# Patient Record
Sex: Male | Born: 1965 | Race: White | Hispanic: No | Marital: Married | Smoking: Never smoker
Health system: Southern US, Community
[De-identification: ages and names within clinical notes are randomized; demographics above are authoritative.]

## PROBLEM LIST (undated history)

## (undated) DIAGNOSIS — I10 Essential (primary) hypertension: Secondary | ICD-10-CM

## (undated) DIAGNOSIS — Z87442 Personal history of urinary calculi: Secondary | ICD-10-CM

## (undated) HISTORY — PX: OTHER SURGICAL HISTORY: SHX169

## (undated) HISTORY — DX: Essential (primary) hypertension: I10

---
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
## 2005-12-10 ENCOUNTER — Ambulatory Visit: Payer: Self-pay | Admitting: Internal Medicine

## 2005-12-10 LAB — CONVERTED CEMR LAB
ALT: 24 units/L (ref 0–40)
AST: 21 units/L (ref 0–37)
Albumin: 4 g/dL (ref 3.5–5.2)
Alkaline Phosphatase: 57 units/L (ref 39–117)
BUN: 12 mg/dL (ref 6–23)
Basophils Absolute: 0.1 10*3/uL (ref 0.0–0.1)
Basophils Relative: 0.9 % (ref 0.0–1.0)
CO2: 30 meq/L (ref 19–32)
Calcium: 9.1 mg/dL (ref 8.4–10.5)
Chloride: 106 meq/L (ref 96–112)
Chol/HDL Ratio, serum: 3.4
Cholesterol: 140 mg/dL (ref 0–200)
Creatinine, Ser: 1 mg/dL (ref 0.4–1.5)
Eosinophil percent: 3.2 % (ref 0.0–5.0)
GFR calc non Af Amer: 88 mL/min
Glomerular Filtration Rate, Af Am: 106 mL/min/{1.73_m2}
Glucose, Bld: 85 mg/dL (ref 70–99)
HCT: 42 % (ref 39.0–52.0)
HDL: 41.2 mg/dL (ref >39.0)
Hemoglobin: 14.6 g/dL (ref 13.0–17.0)
LDL Cholesterol: 86 mg/dL (ref 0–99)
Lymphocytes Relative: 23.7 % (ref 12.0–46.0)
MCHC: 34.8 g/dL (ref 30.0–36.0)
MCV: 87.3 fL (ref 78.0–100.0)
Monocytes Absolute: 0.4 10*3/uL (ref 0.2–0.7)
Monocytes Relative: 7.2 % (ref 3.0–11.0)
Neutro Abs: 3.8 10*3/uL (ref 1.4–7.7)
Neutrophils Relative %: 65 % (ref 43.0–77.0)
Platelets: 244 10*3/uL (ref 150–400)
Potassium: 4 meq/L (ref 3.5–5.1)
RBC: 4.81 M/uL (ref 4.22–5.81)
RDW: 12.3 % (ref 11.5–14.6)
Sodium: 141 meq/L (ref 135–145)
TSH: 0.55 microintl units/mL (ref 0.35–5.50)
Total Bilirubin: 0.8 mg/dL (ref 0.3–1.2)
Total Protein: 6.5 g/dL (ref 6.0–8.3)
Triglyceride fasting, serum: 63 mg/dL (ref 0–149)
VLDL: 13 mg/dL (ref 0–40)
WBC: 5.9 10*3/uL (ref 4.5–10.5)

## 2005-12-15 ENCOUNTER — Ambulatory Visit: Payer: Self-pay | Admitting: Internal Medicine

## 2007-08-01 ENCOUNTER — Ambulatory Visit: Payer: Self-pay | Admitting: Internal Medicine

## 2007-08-08 ENCOUNTER — Telehealth (INDEPENDENT_AMBULATORY_CARE_PROVIDER_SITE_OTHER): Payer: Self-pay | Admitting: *Deleted

## 2007-08-10 ENCOUNTER — Ambulatory Visit: Payer: Self-pay | Admitting: Internal Medicine

## 2009-06-11 ENCOUNTER — Ambulatory Visit: Payer: Self-pay | Admitting: Internal Medicine

## 2009-06-11 DIAGNOSIS — R0989 Other specified symptoms and signs involving the circulatory and respiratory systems: Secondary | ICD-10-CM | POA: Insufficient documentation

## 2009-06-11 DIAGNOSIS — R0609 Other forms of dyspnea: Secondary | ICD-10-CM

## 2009-06-11 DIAGNOSIS — L259 Unspecified contact dermatitis, unspecified cause: Secondary | ICD-10-CM | POA: Insufficient documentation

## 2009-06-26 ENCOUNTER — Encounter: Payer: Self-pay | Admitting: Internal Medicine

## 2009-07-23 ENCOUNTER — Encounter: Payer: Self-pay | Admitting: Internal Medicine

## 2009-09-25 ENCOUNTER — Encounter: Payer: Self-pay | Admitting: Internal Medicine

## 2009-11-20 ENCOUNTER — Encounter: Payer: Self-pay | Admitting: Internal Medicine

## 2010-02-19 ENCOUNTER — Telehealth: Payer: Self-pay | Admitting: Internal Medicine

## 2010-02-20 ENCOUNTER — Ambulatory Visit: Admit: 2010-02-20 | Payer: Self-pay | Admitting: Internal Medicine

## 2010-03-03 NOTE — Letter (Signed)
Summary: [facility]  [facility]   Imported By: Maryln Gottron 06/24/2009 13:07:24  _____________________________________________________________________  External Attachment:    Type:   Image     Comment:   External Document

## 2010-03-03 NOTE — Letter (Signed)
Summary: [facility]  [facility]   Imported By: Maryln Gottron 12/01/2009 13:35:27  _____________________________________________________________________  External Attachment:    Type:   Image     Comment:   External Document

## 2010-03-03 NOTE — Consult Note (Signed)
Summary: [facility]  [facility]   Imported By: Maryln Gottron 07/16/2009 14:45:56  _____________________________________________________________________  External Attachment:    Type:   Image     Comment:   External Document

## 2010-03-03 NOTE — Assessment & Plan Note (Signed)
Summary: eval for sleep apnea?/dm   Vital Signs:  Patient profile:   45 year old male Weight:      177 pounds Pulse rate:   72 / minute Pulse rhythm:   regular Resp:     12 per minute BP sitting:   114 / 78  (left arm) Cuff size:   regular  Vitals Entered By: Gladis Riffle, RN (Jun 11, 2009 12:40 PM) CC: wife c/o snoring--eval for sleep apnea--also right lower leg rash Is Patient Diabetic? No Comments taking pain med prn forachilles tendon   CC:  wife c/o snoring--eval for sleep apnea--also right lower leg rash.  History of Present Illness: wife concerned with snoring  in additionhas a rash---same leg that has he has been wearing a boot  Preventive Screening-Counseling & Management  Alcohol-Tobacco     Smoking Status: never  Current Medications (verified): 1)  No Medications 2)  Percocet 5-325 Mg Tabs (Oxycodone-Acetaminophen) .... As Needed Achilles Pain  Allergies (verified): No Known Drug Allergies  Social History: Smoking Status:  never  Physical Exam  General:  Well-developed,well-nourished,in no acute distress; alert,appropriate and cooperative throughout examination Head:  normocephalic and atraumatic.   Mouth:  good dentition, no dental plaque, and pharynx pink and moist.   Neck:  No deformities, masses, or tenderness noted. Skin:  papular rash right leg below knee   Impression & Recommendations:  Problem # 1:  CONTACT DERMATITIS (ICD-692.9) likely related to boot (achilles rupture) His updated medication list for this problem includes:    Triamcinolone Acetonide 0.5 % Crea (Triamcinolone acetonide) .Marland Kitchen... Apply bid to affected area  Problem # 2:  SNORING (ICD-786.09)  doubt sleep apnea  Orders: ENT Referral (ENT)  Complete Medication List: 1)  No Medications  2)  Percocet 5-325 Mg Tabs (Oxycodone-acetaminophen) .... As needed achilles pain 3)  Triamcinolone Acetonide 0.5 % Crea (Triamcinolone acetonide) .... Apply bid to affected  area Prescriptions: TRIAMCINOLONE ACETONIDE 0.5 % CREA (TRIAMCINOLONE ACETONIDE) apply bid to affected area  #30 grams x 1   Entered and Authorized by:   Birdie Sons MD   Signed by:   Birdie Sons MD on 06/11/2009   Method used:   Electronically to        [facility] [phone]* (retail)       [address]       [place], [place]  [address]       Ph: [phone] or [phone]       Fax: [phone]   RxID:   [id number]

## 2010-03-03 NOTE — Letter (Signed)
Summary: [facility]  [facility]   Imported By: Sherian Rein 10/10/2009 13:49:26  _____________________________________________________________________  External Attachment:    Type:   Image     Comment:   External Document

## 2010-03-03 NOTE — Letter (Signed)
Summary: [facility]  [facility]   Imported By: Maryln Gottron 07/28/2009 14:16:03  _____________________________________________________________________  External Attachment:    Type:   Image     Comment:   External Document

## 2010-03-05 NOTE — Progress Notes (Signed)
Summary: Pt having problems with jaw. Req eval or referral to spec  Phone Note Call from Patient Call back at Work Phone [phone]   Caller: Patient Summary of Call: Pt called and said that he is having problems with his jaw. Painful on one side. Click in and out. Pt has difficulty opening his mouth at times. Pt wants know if he needs to come in to be eval or can he get referral to spec? Pls advise.  Initial call taken by: Lucy Antigua,  February 19, 2010 2:19 PM  Follow-up for Phone Call        he may want to see his dentist first Follow-up by: Birdie Sons MD,  February 20, 2010 6:55 AM  Additional Follow-up for Phone Call Additional follow up Details #1::        Lft pt vm with info noted above.  Additional Follow-up by: Lucy Antigua,  February 20, 2010 8:06 AM

## 2010-04-03 ENCOUNTER — Encounter: Payer: Self-pay | Admitting: Family Medicine

## 2010-04-03 ENCOUNTER — Ambulatory Visit (INDEPENDENT_AMBULATORY_CARE_PROVIDER_SITE_OTHER): Payer: 59 | Admitting: Family Medicine

## 2010-04-03 VITALS — BP 124/84 | Temp 98.1°F | Ht 68.25 in | Wt 167.0 lb

## 2010-04-03 DIAGNOSIS — H9209 Otalgia, unspecified ear: Secondary | ICD-10-CM

## 2010-04-03 DIAGNOSIS — H6690 Otitis media, unspecified, unspecified ear: Secondary | ICD-10-CM

## 2010-04-03 MED ORDER — AMOXICILLIN 875 MG PO TABS: 875.0000 mg | ORAL_TABLET | Freq: Two times a day (BID) | ORAL | Status: AC

## 2010-04-03 NOTE — Patient Instructions (Signed)
Otitis Media (Middle Ear Infection)  You or your child has otitis media. This is an infection of the middle chamber of the ear. This condition is common in young children and often follows upper respiratory infections. Symptoms of otitis media may include earache or ear fullness, hearing loss, or fever. If the ear drum ruptures, a middle ear infection may also cause bloody or pus-like discharge from the ear. Fussiness, irritability, and persistent crying may be the only signs of otitis media in small children.  Otitis media can be caused by a germ (bacteria) or a virus. Medications to kill bacteria (antibiotics) may be used to treat bacterial otitis media. But antibiotics are not effective against viral infections. Not every case of bacterial otitis media requires antibiotics and depending on age, severity of infection, and other risk factors, observation may be all that is required. Ear drops or oral medicines may be prescribed to reduce pain, fever or congestion. Babies with ear infections should not be fed while lying on their backs. This increases the pressure and pain in the ear. Do not put cotton in the ear canal or clean it with cotton swabs. Swimming should be avoided if the eardrum has ruptured or if there is drainage from the ear canal. If your child experiences recurrent infections, your child may need to be referred to an Ear, Nose, and Throat specialist.  HOME CARE INSTRUCTIONS   Take any antibiotic as directed by your caregiver. You or your child may feel better in a few days, but take all medicine or the infection may not respond and may become more difficult to treat.    Only take over-the-counter or prescription medicines for pain, discomfort, or fever as directed by your caregiver. Do not give aspirin to children.   Otitis media can lead to complications including rupture of the ear drum, long term hearing loss, and more severe infections. Call your caregiver for follow-up care at the end of  treatment.  SEEK IMMEDIATE MEDICAL CARE IF:   Your or your child's problems do not improve within 2 to 3 days.    You or your child has an oral temperature above 100, not controlled by medicine.    Your baby is older than 3 months with a rectal temperature of 102 F (38.9 C) or higher.    Your baby is 3 months old or younger with a rectal temperature of 100.4 F (38 C) or higher.    Your child develops increased fussiness.    You or your child develops a stiff neck, severe headache, or confusion.    There is swelling around the ear.    There is dizziness, vomiting, unusual sleepiness, seizures, or twitching of facial muscles.    The pain or ear drainage persists beyond 2 days of antibiotic treatment.   Document Released: 02/26/2004 Document Re-Released: 07/08/2009  [employer] Patient Information 2011 [employer].

## 2010-04-03 NOTE — Progress Notes (Signed)
  Subjective:    Patient ID: John Carey, male    DOB: 1965/11/12, 45 y.o.   MRN: [id number]  HPI  Patient seen with upper respiratory infection which started last weekend. The symptoms gradually started to clear but now has right ear fullness with minimal pain. No vertigo. Fever earlier in the week but none now. No cough. Overall feels better. History of frequent otitis in childhood.   Review of Systems     Objective:   Physical Exam  the patient is alert and nontoxic in appearance. Afebrile. Oropharynx is moist and clear Right eardrum reveals distorted landmarks. Moderate erythema. Nonbulging. No visible perforation. Left ear drum normal Neck is supple no adenopathy Chest good auscultation Heart regular rhythm and rate       Assessment & Plan:   recent viral URI now presenting with probably early right suppurative otitis media. Start amoxicillin 875 mg twice a day for 10 days

## 2010-06-11 ENCOUNTER — Other Ambulatory Visit (INDEPENDENT_AMBULATORY_CARE_PROVIDER_SITE_OTHER): Payer: 59 | Admitting: Internal Medicine

## 2010-06-11 DIAGNOSIS — Z0000 Encounter for general adult medical examination without abnormal findings: Secondary | ICD-10-CM

## 2010-06-11 LAB — LIPID PANEL
Cholesterol: 189 mg/dL (ref 0–200)
LDL Cholesterol: 116 mg/dL — ABNORMAL HIGH (ref 0–99)
Total CHOL/HDL Ratio: 3

## 2010-06-11 LAB — POCT URINALYSIS DIPSTICK
Bilirubin, UA: NEGATIVE
Glucose, UA: NEGATIVE
Leukocytes, UA: NEGATIVE
Nitrite, UA: NEGATIVE
Urobilinogen, UA: 0.2

## 2010-06-11 LAB — HEPATIC FUNCTION PANEL
ALT: 33 U/L (ref 0–53)
AST: 27 U/L (ref 0–37)
Bilirubin, Direct: 0.1 mg/dL (ref 0.0–0.3)
Total Bilirubin: 0.4 mg/dL (ref 0.3–1.2)
Total Protein: 6.6 g/dL (ref 6.0–8.3)

## 2010-06-11 LAB — CBC WITH DIFFERENTIAL/PLATELET
Basophils Relative: 0.9 % (ref 0.0–3.0)
Eosinophils Relative: 1.9 % (ref 0.0–5.0)
HCT: 45.7 % (ref 39.0–52.0)
Lymphs Abs: 2.3 10*3/uL (ref 0.7–4.0)
MCV: 91.9 fl (ref 78.0–100.0)
Monocytes Absolute: 0.7 10*3/uL (ref 0.1–1.0)
Monocytes Relative: 8.4 % (ref 3.0–12.0)
Platelets: 257 10*3/uL (ref 150.0–400.0)
RBC: 4.97 Mil/uL (ref 4.22–5.81)
WBC: 8.9 10*3/uL (ref 4.5–10.5)

## 2010-06-11 LAB — BASIC METABOLIC PANEL
BUN: 18 mg/dL (ref 6–23)
Chloride: 102 mEq/L (ref 96–112)
GFR: 90.92 mL/min (ref >60.00)
Potassium: 4.9 mEq/L (ref 3.5–5.1)
Sodium: 140 mEq/L (ref 135–145)

## 2010-06-11 LAB — TSH: TSH: 0.64 u[IU]/mL (ref 0.35–5.50)

## 2010-06-23 ENCOUNTER — Encounter: Payer: Self-pay | Admitting: Internal Medicine

## 2010-06-23 ENCOUNTER — Ambulatory Visit (INDEPENDENT_AMBULATORY_CARE_PROVIDER_SITE_OTHER): Payer: 59 | Admitting: Internal Medicine

## 2010-06-23 VITALS — BP 102/78 | HR 64 | Temp 98.6°F | Ht 68.5 in | Wt 170.0 lb

## 2010-06-23 DIAGNOSIS — Z23 Encounter for immunization: Secondary | ICD-10-CM

## 2010-06-23 DIAGNOSIS — Z0000 Encounter for general adult medical examination without abnormal findings: Secondary | ICD-10-CM

## 2010-06-23 NOTE — Progress Notes (Signed)
  Subjective:    Patient ID: John Carey, male    DOB: November 27, 1965, 45 y.o.   MRN: [id number]  HPI  cpx Exercises regularly  Past Medical History  Diagnosis Date  . Migraine    Past Surgical History  Procedure Date  . Kidney stone surgery     right-open removal    reports that he has never smoked. He does not have any smokeless tobacco history on file. He reports that he drinks alcohol. He reports that he does not use illicit drugs. family history includes Hypertension in his father and Parkinsonism in his mother.  There is no history of Diabetes. No Known Allergies  Review of Systems  patient denies chest pain, shortness of breath, orthopnea. Denies lower extremity edema, abdominal pain, change in appetite, change in bowel movements. Patient denies rashes, musculoskeletal complaints. No other specific complaints in a complete review of systems.      Objective:   Physical Exam  well-developed well-nourished male in no acute distress. HEENT exam atraumatic, normocephalic, neck supple without jugular venous distention. Chest clear to auscultation cardiac exam S1-S2 are regular. Abdominal exam  with bowel sounds, soft and nontender. Suprapubic scar. Extremities no edema. Neurologic exam is alert with a normal gait. GU---no hernia, no testicular masses        Assessment & Plan:  Well vist tdap today

## 2011-07-26 ENCOUNTER — Telehealth: Payer: Self-pay | Admitting: Internal Medicine

## 2011-07-26 NOTE — Telephone Encounter (Signed)
Caller: Thomson/Patient; PCP: Birdie Sons; CB#: [phone]; ; ; Call regarding Other; Onset 06/25/11; fingers lost circulation and were numb; patient was able to get feeling back after taking a warm shower; he reports that his fingers were white, cold, and numb; Patient reports this also happened last fall; Patient reports he spoke with EMS members at the football game where this happened and they recommended going to ED, but patient did not go due to hands returning to normal after getting warmed up. Emergent s/s of "Periodic changes in skin color (white or blue, followed by redness) with throbbing or tingling following cold exposure or emotional stress" positive per Hand Non-Injury guideline. Appointment scheduled with Dr. Amador Cunas for 07/27/11 at 8:15am to be evaluated for these symptoms.

## 2011-07-27 ENCOUNTER — Encounter: Payer: Self-pay | Admitting: Internal Medicine

## 2011-07-27 ENCOUNTER — Ambulatory Visit (INDEPENDENT_AMBULATORY_CARE_PROVIDER_SITE_OTHER): Payer: 59 | Admitting: Internal Medicine

## 2011-07-27 VITALS — BP 110/72 | Temp 97.8°F | Wt 161.0 lb

## 2011-07-27 DIAGNOSIS — I7300 Raynaud's syndrome without gangrene: Secondary | ICD-10-CM

## 2011-07-27 NOTE — Progress Notes (Signed)
  Subjective:    Patient ID: John Carey, male    DOB: 1965-10-13, 46 y.o.   MRN: [id number]  HPI  46 year old patient whose past medical history is unremarkable except for history depression and migraine headaches.  He complains of numbness and blanching of several fingertips that has occurred on 2 occasions. One in November of last year and another early last month both on exposure to the cold environment.  He took a picture with  his cell phone which he presents today with reveals extreme pallor involving 3 distal digits of the left hand and 2 digits of the right hand. Symptoms resolved with warming on both occasions. He has had no episodes precipitated by use of Relpax.    Review of Systems  Constitutional: Negative for fever, chills, appetite change and fatigue.  HENT: Negative for hearing loss, ear pain, congestion, sore throat, trouble swallowing, neck stiffness, dental problem, voice change and tinnitus.   Eyes: Negative for pain, discharge and visual disturbance.  Respiratory: Negative for cough, chest tightness, wheezing and stridor.   Cardiovascular: Negative for chest pain, palpitations and leg swelling.  Gastrointestinal: Negative for nausea, vomiting, abdominal pain, diarrhea, constipation, blood in stool and abdominal distention.  Genitourinary: Negative for urgency, hematuria, flank pain, discharge, difficulty urinating and genital sores.  Musculoskeletal: Negative for myalgias, back pain, joint swelling, arthralgias and gait problem.  Skin: Negative for rash.  Neurological: Positive for numbness. Negative for dizziness, syncope, speech difficulty, weakness and headaches.  Hematological: Negative for adenopathy. Does not bruise/bleed easily.  Psychiatric/Behavioral: Negative for behavioral problems and dysphoric mood. The patient is not nervous/anxious.        Objective:   Physical Exam  Constitutional: He appears well-developed and well-nourished. No distress.   Blood pressure 120/80 in both arms  Cardiovascular: Intact distal pulses.           Assessment & Plan:   Raynaud's  Phenomenon.  Information was dispensed he will attempt to avoid cold exposure to his hands. He'll will report any clinical worsening

## 2011-07-27 NOTE — Patient Instructions (Addendum)
Protect  hands from cold exposure as discussed  Call or return to clinic prn if these symptoms worsen or fail to improve as anticipated. Raynaud's Syndrome Raynaud's Syndrome is a disorder of the blood vessels in your hands and feet. It occurs when small arteries of the arms/hands or legs/feet become sensitive to cold or emotional upset. This causes the arteries to constrict, or narrow, and reduces blood flow to the area. The color in the fingers or toes changes from white to bluish to red and this is not usually painful. There may be numbness and tingling. Sores on the skin (ulcers) can form. Symptoms are usually relieved by warming. HOME CARE INSTRUCTIONS    Avoid exposure to cold. Keep your whole body warm and dry. Dress in layers. Wear mittens or gloves when handling ice or frozen food and when outdoors. Use holders for glasses or cans containing cold drinks. If possible, stay indoors during cold weather.   Limit your use of caffeine. Switch to decaffeinated coffee, tea, and soda pop. Avoid chocolate.   Avoid smoking or being around cigarette smoke. Smoke will make symptoms worse.   Wear loose fitting socks and comfortable, roomy shoes.   Avoid vibrating tools and machinery.   If possible, avoid stressful and emotional situations. Exercise, meditation and yoga may help you cope with stress. Biofeedback may be useful.   Ask your caregiver about medicine (calcium channel blockers) that may control Raynaud's phenomena.  SEEK MEDICAL CARE IF:    Your discomfort becomes worse, despite conservative treatment.   You develop sores on your fingers and toes that do not heal.  Document Released: 01/16/2000 Document Revised: 01/07/2011 Document Reviewed: 01/23/2008 [facility] Patient Information 2012 [place], [place].

## 2013-03-26 ENCOUNTER — Encounter: Payer: Self-pay | Admitting: Family Medicine

## 2013-03-26 ENCOUNTER — Ambulatory Visit (INDEPENDENT_AMBULATORY_CARE_PROVIDER_SITE_OTHER): Payer: 59 | Admitting: Family Medicine

## 2013-03-26 VITALS — BP 140/90 | Temp 97.9°F | Wt 167.0 lb

## 2013-03-26 DIAGNOSIS — K5900 Constipation, unspecified: Secondary | ICD-10-CM

## 2013-03-26 NOTE — Patient Instructions (Signed)
-  daily fiber supplement   -mirilax when constipated - mix in liquid per instructions once daily for 1 week (if not successful in 3 days twice daily for 3 days)  -follow up fo physical next available with your doctor and as needed

## 2013-03-26 NOTE — Progress Notes (Signed)
Chief Complaint  Patient presents with  . Abdominal Pain    and back pain, tender to touch, bloating     HPI:  John Carey is a 48 yo patient of Dr. Leanne Chang with a PMH migraines and nephrolithiasis here for an Acute visit for:  Abd discomfort: -none currently -started:    2 days ago                                                                   -location and character:no pain currently, reports" not a bad pain', intermittent, more a cramp, had some muscle pain in R back 3 days ago,then had pain in R mid abd, was a little tender to touch 2 days ago, feels full    -other symptoms: had constipation last week and took laxative yesterday and had a BM and now pain is much better today -denies: fevers, vomiting, diarrhea, hematochezia, melena, severe pain, urinary symptoms, normal appetite, tol POs normally -has hx of intermittent issues with constipation  ROS: See pertinent positives and negatives per HPI.  Past Medical History  Diagnosis Date  . Migraine     Past Surgical History  Procedure Laterality Date  . Kidney stone surgery      right-open removal    Family History  Problem Relation Age of Onset  . Parkinsonism Mother   . Hypertension Father   . Diabetes Neg Hx     History   Social History  . Marital Status: Married    Spouse Name: N/A    Number of Children: N/A  . Years of Education: N/A   Social History Main Topics  . Smoking status: Never Smoker   . Smokeless tobacco: None  . Alcohol Use: Yes     Comment: social  . Drug Use: No  . Sexual Activity: None   Other Topics Concern  . None   Social History Narrative  . None    Current outpatient prescriptions:eletriptan (RELPAX) 40 MG tablet, One tablet by mouth as needed for migraine headache.  If the headache improves and then returns, dose may be repeated after 2 hours have elapsed since first dose (do not exceed 80 mg per day). may repeat in 2 hours if necessary, per Dr Domingo Cocking , Disp: , Rfl: ;   Milnacipran HCl (SAVELLA) 50 MG TABS, Take by mouth daily. Per Dr Domingo Cocking , Disp: , Rfl:   EXAM:  Filed Vitals:   03/26/13 1103  BP: 140/90  Temp: 97.9 F (36.6 C)    Body mass index is 25.02 kg/(m^2).  GENERAL: vitals reviewed and listed above, alert, oriented, appears well hydrated and in no acute distress  HEENT: atraumatic, conjunttiva clear, no obvious abnormalities on inspection of external nose and ears  NECK: no obvious masses on inspection  LUNGS: clear to auscultation bilaterally, no wheezes, rales or rhonchi, good air movement  CV: HRRR, no peripheral edema  ABD: BS +, soft, NTTP  MS: moves all extremities without noticeable abnormality  PSYCH: pleasant and cooperative, no obvious depression or anxiety  ASSESSMENT AND PLAN:  Discussed the following assessment and plan:  Unspecified constipation  -pain is now resolved after tx for constipation and benign exam -we discussed possible serious and likely etiologies, workup and treatment, treatment risks and  return precautions -after this discussion, John Carey opted for tx of constipation -follow up advised with PCP for physical an as needed -of course, we advised John Carey  to return or notify a doctor immediately if symptoms worsen or persist or new concerns arise.  -Patient advised to return or notify a doctor immediately if symptoms worsen or persist or new concerns arise.  Patient Instructions  -daily fiber supplement   -mirilax when constipated - mix in liquid per instructions once daily for 1 week (if not successful in 3 days twice daily for 3 days)  -follow up fo physical next available with your doctor and as needed     Colin Benton R.

## 2013-03-26 NOTE — Progress Notes (Signed)
Pre visit review using our clinic review tool, if applicable. No additional management support is needed unless otherwise documented below in the visit note. 

## 2013-05-28 ENCOUNTER — Encounter: Payer: Self-pay | Admitting: Family Medicine

## 2013-05-28 ENCOUNTER — Ambulatory Visit (INDEPENDENT_AMBULATORY_CARE_PROVIDER_SITE_OTHER): Payer: 59 | Admitting: Family Medicine

## 2013-05-28 VITALS — BP 138/88 | HR 68 | Temp 98.1°F | Ht 68.0 in | Wt 174.0 lb

## 2013-05-28 DIAGNOSIS — IMO0001 Reserved for inherently not codable concepts without codable children: Secondary | ICD-10-CM

## 2013-05-28 DIAGNOSIS — K5900 Constipation, unspecified: Secondary | ICD-10-CM

## 2013-05-28 DIAGNOSIS — J310 Chronic rhinitis: Secondary | ICD-10-CM

## 2013-05-28 DIAGNOSIS — R030 Elevated blood-pressure reading, without diagnosis of hypertension: Secondary | ICD-10-CM

## 2013-05-28 DIAGNOSIS — J329 Chronic sinusitis, unspecified: Secondary | ICD-10-CM

## 2013-05-28 NOTE — Progress Notes (Signed)
Pre visit review using our clinic review tool, if applicable. No additional management support is needed unless otherwise documented below in the visit note. 

## 2013-05-28 NOTE — Progress Notes (Signed)
No chief complaint on file.   HPI:  Acute visit for:  1) Ear Pain: -nasal congestion, R ear pressure and popping, PND for about 1 week -denies ear pain, fevers, body aches, SOB, sinus pain, ebola riks -thinks allergies, taking zyrtec  2) Elevated BP: -reports never elevated like this  3) Constipation: -resolved ROS: See pertinent positives and negatives per HPI.  Past Medical History  Diagnosis Date  . Migraine     Past Surgical History  Procedure Laterality Date  . Kidney stone surgery      right-open removal    Family History  Problem Relation Age of Onset  . Parkinsonism Mother   . Hypertension Father   . Diabetes Neg Hx     History   Social History  . Marital Status: Married    Spouse Name: N/A    Number of Children: N/A  . Years of Education: N/A   Social History Main Topics  . Smoking status: Never Smoker   . Smokeless tobacco: None  . Alcohol Use: Yes     Comment: social  . Drug Use: No  . Sexual Activity: None   Other Topics Concern  . None   Social History Narrative  . None    Current outpatient prescriptions:eletriptan (RELPAX) 40 MG tablet, One tablet by mouth as needed for migraine headache.  If the headache improves and then returns, dose may be repeated after 2 hours have elapsed since first dose (do not exceed 80 mg per day). may repeat in 2 hours if necessary, per Dr Domingo Cocking , Disp: , Rfl: ;  Milnacipran HCl (SAVELLA) 50 MG TABS, Take by mouth daily. Per Dr Domingo Cocking , Disp: , Rfl:   EXAM:  Filed Vitals:   05/28/13 1310  BP: 138/88  Pulse: 68  Temp: 98.1 F (36.7 C)    Body mass index is 26.46 kg/(m^2).  GENERAL: vitals reviewed and listed above, alert, oriented, appears well hydrated and in no acute distress  HEENT: atraumatic, conjunttiva clear, no obvious abnormalities on inspection of external nose and ears, normal appearance of ear canals and TMs, clear nasal congestion, mild post oropharyngeal erythema with PND, no  tonsillar edema or exudate, no sinus TTP  NECK: no obvious masses on inspection  LUNGS: clear to auscultation bilaterally, no wheezes, rales or rhonchi, good air movement  CV: HRRR, no peripheral edema  MS: moves all extremities without noticeable abnormality  PSYCH: pleasant and cooperative, no obvious depression or anxiety  ASSESSMENT AND PLAN:  Discussed the following assessment and plan:  Rhinosinusitis -likely viral or allergic -discussed options, tx per instructions, return precautions  Elevated BP -much better on recheck, monitor at physical - advised to schedule  physical in next 1-2 months  Unspecified constipation -reports completely resolved  -Patient advised to return or notify a doctor immediately if symptoms worsen or persist or new concerns arise.  Patient Instructions  INSTRUCTIONS FOR UPPER RESPIRATORY INFECTION:  -plenty of rest and fluids  -nasal steroid such as nasocort per instructions for 21 days - effect usually in 1-2 weeks  -can use afrin nasal spray for drainage and nasal congestion - but do NOT use longer then 3-4 days  -follow up if you have fevers, facial pain, tooth pain, difficulty breathing or are worsening or not getting better in 5-7 days      Lucretia Kern

## 2013-05-28 NOTE — Patient Instructions (Signed)
INSTRUCTIONS FOR UPPER RESPIRATORY INFECTION:  -plenty of rest and fluids  -nasal steroid such as nasocort per instructions for 21 days - effect usually in 1-2 weeks  -can use afrin nasal spray for drainage and nasal congestion - but do NOT use longer then 3-4 days  -follow up if you have fevers, facial pain, tooth pain, difficulty breathing or are worsening or not getting better in 5-7 days

## 2016-02-06 ENCOUNTER — Encounter: Payer: Self-pay | Admitting: Family Medicine

## 2016-02-06 ENCOUNTER — Ambulatory Visit (INDEPENDENT_AMBULATORY_CARE_PROVIDER_SITE_OTHER): Payer: 59 | Admitting: Family Medicine

## 2016-02-06 VITALS — BP 120/82 | HR 76 | Temp 98.1°F | Ht 68.0 in | Wt 167.5 lb

## 2016-02-06 DIAGNOSIS — H65191 Other acute nonsuppurative otitis media, right ear: Secondary | ICD-10-CM

## 2016-02-06 DIAGNOSIS — J069 Acute upper respiratory infection, unspecified: Secondary | ICD-10-CM | POA: Diagnosis not present

## 2016-02-06 DIAGNOSIS — H6981 Other specified disorders of Eustachian tube, right ear: Secondary | ICD-10-CM | POA: Diagnosis not present

## 2016-02-06 MED ORDER — AMOXICILLIN 875 MG PO TABS: 875.0000 mg | ORAL_TABLET | Freq: Two times a day (BID) | ORAL | 0 refills | Status: DC

## 2016-02-06 NOTE — Progress Notes (Signed)
HPI:  Acute visit for R Ear Pain: -started: 1 week ago -symptoms:nasal congestion, sore throat, cough - this got better but has had some R ear pain for 3 days  -denies:fever, SOB, NVD, tooth pain, drainage from ear -has tried: nothing -sick contacts/travel/risks: no reported flu, strep or tick exposure -Hx of: hx ear infections  ROS: See pertinent positives and negatives per HPI.  Past Medical History:  Diagnosis Date  . Migraine     Past Surgical History:  Procedure Laterality Date  . KIDNEY STONE SURGERY     right-open removal    Family History  Problem Relation Age of Onset  . Parkinsonism Mother   . Hypertension Father   . Diabetes Neg Hx     Social History   Social History  . Marital status: Married    Spouse name: N/A  . Number of children: N/A  . Years of education: N/A   Social History Main Topics  . Smoking status: Never Smoker  . Smokeless tobacco: None  . Alcohol use Yes     Comment: social  . Drug use: No  . Sexual activity: Not Asked   Other Topics Concern  . None   Social History Narrative  . None     Current Outpatient Prescriptions:  .  eletriptan (RELPAX) 40 MG tablet, One tablet by mouth as needed for migraine headache.  If the headache improves and then returns, dose may be repeated after 2 hours have elapsed since first dose (do not exceed 80 mg per day). may repeat in 2 hours if necessary, per Dr Domingo Cocking , Disp: , Rfl:  .  Milnacipran HCl (SAVELLA) 50 MG TABS, Take by mouth daily. Per Dr Domingo Cocking , Disp: , Rfl:  .  amoxicillin (AMOXIL) 875 MG tablet, Take 1 tablet (875 mg total) by mouth 2 (two) times daily., Disp: 20 tablet, Rfl: 0  EXAM:  Vitals:   02/06/16 1535  BP: 120/82  Pulse: 76  Temp: 98.1 F (36.7 C)    Body mass index is 25.47 kg/m.  GENERAL: vitals reviewed and listed above, alert, oriented, appears well hydrated and in no acute distress  HEENT: atraumatic, conjunttiva clear, no obvious abnormalities on  inspection of external nose and ears, normal appearance of ear canals and TMs except for midly discolored effusion R with mild dullness and redness R TM, clear nasal congestion, mild post oropharyngeal erythema with PND, no tonsillar edema or exudate, no sinus TTP  NECK: no obvious masses on inspection  LUNGS: clear to auscultation bilaterally, no wheezes, rales or rhonchi, good air movement  CV: HRRR, no peripheral edema  MS: moves all extremities without noticeable abnormality  PSYCH: pleasant and cooperative, no obvious depression or anxiety  ASSESSMENT AND PLAN:  Discussed the following assessment and plan:  Acute upper respiratory infection  Acute effusion of right ear  Dysfunction of right eustachian tube  -given HPI and exam findings today, a serious infection or illness is unlikely. We discussed potential etiologies, with VURI and ETD with developing R OM being most likely. We discussed treatment options, side effects, likely course, antibiotic misuse, transmission, and signs of developing a serious illness. We opted to treat the ETD with a ashort course of nasal decongestant, INS x 3 weeks and abx if symptoms worsening or not resolving over the next few days. -due for colon cancer screening - discussed options and advised assistant to order cologuard per his preferences -advised pt to schedule CPE -of course, we advised to return  or notify a doctor immediately if symptoms worsen or persist or new concerns arise.    Patient Instructions  BEFORE YOU LEAVE: -order cologuard -follow up: 1) Schedule CPE with Dr. Maudie Mercury in next 2 months - if convenient please come fasting  AFRIN nasal spray per instructions for 4 days then stop. Do not use longer then 4 days.  Flonase 2 sprays each nostril daily for 3 weeks.  Take the antibiotic if worsening symptoms or symptoms persist over the next 3-5 days despite treatments above.     Colin Benton R., DO

## 2016-02-06 NOTE — Progress Notes (Signed)
Pre visit review using our clinic review tool, if applicable. No additional management support is needed unless otherwise documented below in the visit note. 

## 2016-02-06 NOTE — Patient Instructions (Signed)
BEFORE YOU LEAVE: -order cologuard -follow up: 1) Schedule CPE with Dr. Maudie Mercury in next 2 months - if convenient please come fasting  AFRIN nasal spray per instructions for 4 days then stop. Do not use longer then 4 days.  Flonase 2 sprays each nostril daily for 3 weeks.  Take the antibiotic if worsening symptoms or symptoms persist over the next 3-5 days despite treatments above.

## 2016-02-19 ENCOUNTER — Ambulatory Visit (INDEPENDENT_AMBULATORY_CARE_PROVIDER_SITE_OTHER): Payer: Self-pay | Admitting: Orthopedic Surgery

## 2016-02-20 ENCOUNTER — Ambulatory Visit (INDEPENDENT_AMBULATORY_CARE_PROVIDER_SITE_OTHER): Payer: 59 | Admitting: Orthopedic Surgery

## 2016-02-20 ENCOUNTER — Encounter (INDEPENDENT_AMBULATORY_CARE_PROVIDER_SITE_OTHER): Payer: Self-pay | Admitting: Orthopedic Surgery

## 2016-02-20 ENCOUNTER — Ambulatory Visit (INDEPENDENT_AMBULATORY_CARE_PROVIDER_SITE_OTHER): Payer: 59

## 2016-02-20 DIAGNOSIS — M1611 Unilateral primary osteoarthritis, right hip: Secondary | ICD-10-CM

## 2016-02-20 NOTE — Progress Notes (Signed)
   Office Visit Note   Patient: John Carey           Date of Birth: 26-Nov-1965           MRN: [id number] Visit Date: 02/20/2016 Requested by: Lisabeth Pick, MD [place], [place] [address] PCP: Lucretia Kern., DO  Subjective: Chief Complaint  Patient presents with  . Left Hip - Pain    HPI John Carey is a 51 year old active patient with right hip pain.  He's had a few months of increasing pain which typically he can deal with.  Last several weeks that it's been pain on a daily basis.  He localizes the pain to the groin.  He has really decreased his treadmill and running and has instead focused on your the.  He takes ibuprofen occasionally.  Patient states at times he is limping.  Some days are better than others.  The pain will not wake him from sleep.  He works in Editor, commissioning at [employer] reviewed are negative as they relate to the chief complaint within the history of present illness.  Patient denies  fevers or chills.    Assessment & Plan: Visit Diagnoses:  1. Primary osteoarthritis of right hip     Plan: Impression is right hip arthritis.  Plan is for him to continue with activity as tolerated until he really has enough pain that warrants surgical intervention.  For some of this acute pain that he's having I do want to try a hip injection with Dr. Ernestina Patches.  I will see him back as needed.  Follow-Up Instructions: Return for Follow-up with Dr. Ernestina Patches for hip injection.   Orders:  Orders Placed This Encounter  Procedures  . XR HIP UNILAT W OR W/O PELVIS 1V RIGHT  . Ambulatory referral to Physical Medicine Rehab   No orders of the defined types were placed in this encounter.     Procedures: No procedures performed   Clinical Data: No additional findings.  Objective: Vital Signs: There were no vitals taken for this visit.  Physical Exam  Ortho Exam  Specialty Comments:  No specialty comments  available.  Imaging: Xr Hip Unilat W Or W/o Pelvis 1v Right  Result Date: 02/20/2016 AP lateral right hip reviewed.  End-stage arthritis is present.  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  Bone quality of the cortex of the femur is otherwise intact    PMFS History: Patient Active Problem List   Diagnosis Date Noted  . Raynauds phenomenon 07/27/2011   Past Medical History:  Diagnosis Date  . Migraine     Family History  Problem Relation Age of Onset  . Parkinsonism Mother   . Hypertension Father   . Diabetes Neg Hx     Past Surgical History:  Procedure Laterality Date  . KIDNEY STONE SURGERY     right-open removal   Social History   Occupational History  . Not on file.   Social History Main Topics  . Smoking status: Never Smoker  . Smokeless tobacco: Never Used  . Alcohol use Yes     Comment: social  . Drug use: No  . Sexual activity: Not on file

## 2016-02-24 ENCOUNTER — Ambulatory Visit (INDEPENDENT_AMBULATORY_CARE_PROVIDER_SITE_OTHER): Payer: 59 | Admitting: Physical Medicine and Rehabilitation

## 2016-02-24 ENCOUNTER — Encounter (INDEPENDENT_AMBULATORY_CARE_PROVIDER_SITE_OTHER): Payer: Self-pay | Admitting: Physical Medicine and Rehabilitation

## 2016-02-24 VITALS — BP 127/83

## 2016-02-24 DIAGNOSIS — M25551 Pain in right hip: Secondary | ICD-10-CM

## 2016-02-24 NOTE — Progress Notes (Signed)
   John Carey - 51 y.o. male MRN [id number]  Date of birth: Feb 20, 1965  Office Visit Note: Visit Date: 02/24/2016 PCP: Lucretia Kern., DO Referred by: Lucretia Kern, DO  Subjective: Chief Complaint  Patient presents with  . Right Hip - Pain   HPI: [name] is a 51 year old gentleman with chronic worsening severe right hip and groin pain for a couple of years and worse recently. Pain level varies. Worse with certain movements. Occasionally radiates down leg to calf.he has failed conservative care and Dr. Marlou Sa requests a diagnostic and hopefully therapeutic injection.    ROS Otherwise per HPI.  Assessment & Plan: Visit Diagnoses:  1. Pain in right hip     Plan: Findings:  Right hip anesthetic arthrogram diagnostically with therapeutic. Patient did get good relief in anesthetic portion of the injection.    Meds & Orders: No orders of the defined types were placed in this encounter.   Orders Placed This Encounter  Procedures  . Large Joint Injection/Arthrocentesis    Follow-up: Return if symptoms worsen or fail to improve, 2 weeks.   Procedures: Hip anesthetic arthrogram Date/Time: 02/24/2016 4:01 PM Performed by: Magnus Sinning Authorized by: Magnus Sinning   Consent Given by:  Patient Site marked: the procedure site was marked   Timeout: prior to procedure the correct patient, procedure, and site was verified   Indications:  Pain and diagnostic evaluation Location:  Hip Site:  R hip joint Prep: patient was prepped and draped in usual sterile fashion   Needle Size:  22 G Approach:  Anterior Ultrasound Guidance: No   Fluoroscopic Guidance: No   Arthrogram: Yes   Medications:  4 mL lidocaine 2 %; 80 mg triamcinolone acetonide 40 MG/ML Aspiration Attempted: Yes   Patient tolerance:  Patient tolerated the procedure well with no immediate complications  Arthrogram demonstrated excellent flow of contrast throughout the joint surface without extravasation or  obvious defect.  The patient had relief of symptoms during the anesthetic phase of the injection.      No notes on file   Clinical History: No specialty comments available.  He reports that he has never smoked. He has never used smokeless tobacco. No results for input(s): HGBA1C, LABURIC in the last 8760 hours.  Objective:  VS:  HT:    WT:   BMI:     BP:127/83  HR: bpm  TEMP: ( )  RESP:  Physical Exam  Musculoskeletal:  Good distal strength and ambulates without aid but he does have concordant pain with internal rotation of the right hip.    Ortho Exam Imaging: No results found.  Past Medical/Family/Surgical/Social History: Medications & Allergies reviewed per EMR Patient Active Problem List   Diagnosis Date Noted  . Raynauds phenomenon 07/27/2011   Past Medical History:  Diagnosis Date  . Migraine    Family History  Problem Relation Age of Onset  . Parkinsonism Mother   . Hypertension Father   . Diabetes Neg Hx    Past Surgical History:  Procedure Laterality Date  . KIDNEY STONE SURGERY     right-open removal   Social History   Occupational History  . Not on file.   Social History Main Topics  . Smoking status: Never Smoker  . Smokeless tobacco: Never Used  . Alcohol use Yes     Comment: social  . Drug use: No  . Sexual activity: Not on file

## 2016-02-24 NOTE — Patient Instructions (Signed)

## 2016-02-25 MED ORDER — LIDOCAINE HCL 2 % IJ SOLN
4.0000 mL | INTRAMUSCULAR | Status: AC | PRN
  Administered 2016-02-24: 4 mL

## 2016-02-25 MED ORDER — TRIAMCINOLONE ACETONIDE 40 MG/ML IJ SUSP
80.0000 mg | INTRAMUSCULAR | Status: AC | PRN
  Administered 2016-02-24: 80 mg via INTRA_ARTICULAR

## 2016-03-02 ENCOUNTER — Telehealth (INDEPENDENT_AMBULATORY_CARE_PROVIDER_SITE_OTHER): Payer: Self-pay | Admitting: Radiology

## 2016-03-02 NOTE — Telephone Encounter (Signed)
Rash at the injection site from last week.  He does itching. He said it appears almost to look like Poison ivy. He has been using hydrocortisone cream on but its not doing to much.  Asking what else he can do for it?

## 2016-03-02 NOTE — Telephone Encounter (Signed)
Take bendaryl oral 25mg  up to TID or find OTC benadryl lotion. Keep lotioned with regular lotion. If skin rash mainly should be self limited and may be due to chloroprep or bandaid most likely.  If worsens give [place] a call

## 2016-03-02 NOTE — Telephone Encounter (Signed)
Called patient and left message. Also advised him to call if the problem worsens or if he has any further questions.

## 2016-04-15 NOTE — Progress Notes (Signed)
HPI:  Here for CPE: Not seen for CPE in some time. Due for labs, colon cancer screening, prostate cancer screening discussion. -Concerns and/or follow up today: none  -Diet: variety of foods, balance and well rounded  -Exercise: regular exercise  -Diabetes and Dyslipidemia Screening:fasting  -Hx of HTN: no  -Vaccines: UTD  -sexual activity: yes, male partner, no new partners  -wants STI testing, Hep C screening (if born 08-1964): no  -FH colon or prstate ca: see FH Last colon cancer screening: not done, wants cologard Last prostate ca screening: discussed risks/benefits DRE and PSA and offered given Fx - he declined both today  -Alcohol, Tobacco, drug use: see social history  Review of Systems - no fevers, unintentional weight loss, vision loss, hearing loss, chest pain, sob, hemoptysis, melena, hematochezia, hematuria, genital discharge, changing or concerning skin lesions, bleeding, bruising, loc, thoughts of self harm or SI  Past Medical History:  Diagnosis Date  . Migraine     Past Surgical History:  Procedure Laterality Date  . KIDNEY STONE SURGERY     right-open removal    Family History  Problem Relation Age of Onset  . Parkinsonism Mother   . Hypertension Father   . Prostate cancer Father 50  . Diabetes Neg Hx     Social History   Social History  . Marital status: Married    Spouse name: N/A  . Number of children: N/A  . Years of education: N/A   Social History Main Topics  . Smoking status: Never Smoker  . Smokeless tobacco: Never Used  . Alcohol use Yes     Comment: social  . Drug use: No  . Sexual activity: Not Asked   Other Topics Concern  . None   Social History Narrative  . None     Current Outpatient Prescriptions:  .  eletriptan (RELPAX) 40 MG tablet, One tablet by mouth as needed for migraine headache.  If the headache improves and then returns, dose may be repeated after 2 hours have elapsed since first dose (do not  exceed 80 mg per day). may repeat in 2 hours if necessary, per Dr Domingo Cocking , Disp: , Rfl:  .  Milnacipran HCl (SAVELLA) 50 MG TABS, Take by mouth daily. Per Dr Domingo Cocking , Disp: , Rfl:   EXAM:  Vitals:   04/16/16 0818  BP: 118/70  Pulse: (!) 58  Temp: 98.2 F (36.8 C)  TempSrc: Oral  Weight: 162 lb 6.4 oz (73.7 kg)  Height: 5' 7.5" (1.715 m)    Estimated body mass index is 25.06 kg/m as calculated from the following:   Height as of this encounter: 5' 7.5" (1.715 m).   Weight as of this encounter: 162 lb 6.4 oz (73.7 kg).  GENERAL: vitals reviewed and listed below, alert, oriented, appears well hydrated and in no acute distress  HEENT: head atraumatic, PERRLA, normal appearance of eyes, ears, nose and mouth. moist mucus membranes.  NECK: supple, no masses or lymphadenopathy  LUNGS: clear to auscultation bilaterally, no rales, rhonchi or wheeze  CV: HRRR, no peripheral edema or cyanosis, normal pedal pulses  ABDOMEN: bowel sounds normal, soft, non tender to palpation, no masses, no rebound or guarding  GU: Declined  SKIN: no rash or abnormal lesions, multiple moles -all appear rather similar  MS: normal gait, moves all extremities normally  NEURO: normal gait, speech and thought processing grossly intact, muscle tone grossly intact throughout  PSYCH: normal affect, pleasant and cooperative  ASSESSMENT AND PLAN:  Discussed  the following assessment and plan:  Encounter for preventive measure - Plan: Lipid panel, Hemoglobin A1c  Other migraine without status migrainosus, not intractable - -sees Dr. Bevelyn Ngo and relpax  Numerous moles   -Discussed and advised all [place] preventive services health task force level A and B recommendations for age, sex and risks.  --Advised at least 150 minutes of exercise per week and a healthy diet with avoidance of (less then 1 serving per week) processed foods, white starches, red meat, fast foods and sweets and consisting of: *  5-9 servings of fresh fruits and vegetables (not corn or potatoes) *nuts and seeds, beans *olives and olive oil *lean meats such as fish and white chicken  *whole grains  -skin check with derm every 1-2 years given many moles  -cologard - assistant to order  -labs, studies and vaccines per orders this encounter   Patient advised to return to clinic immediately if symptoms worsen or persist or new concerns.  Patient Instructions  BEFORE YOU LEAVE: -2 question depression screen in epic -cologard -follow up: 1 year for CPE -labs  We ordered the Cologuard test for colon cancer screening. Please complete this test promptly once the kit arrives. Please contact us if you have not received your kit in the next few weeks.  Vit D3 [phone] IU daily - Source naturals vit D3 drops, cosco or sma's club vit d3  We have ordered labs or studies at this visit. It can take up to 1-2 weeks for results and processing. IF results require follow up or explanation, we will call you with instructions. Clinically stable results will be released to your [facility]. If you have not heard from [place] or cannot find your results in [facility] in 2 weeks please contact our office at [phone].  If you are not yet signed up for [facility], please consider signing up.   We recommend the following healthy lifestyle for LIFE: 1) Small portions.   Tip: eat off of a salad plate instead of a dinner plate.  Tip: if you need more or a snack choose fruits, veggies and/or a handful of nuts or seeds.  2) Eat a healthy clean diet.  * Tip: Avoid (less then 1 serving per week): processed foods, sweets, sweetened drinks, white starches (rice, flour, bread, potatoes, pasta, etc), red meat, fast foods, butter  *Tip: CHOOSE instead   * 5-9 servings per day of fresh or frozen fruits and vegetables (but not corn, potatoes, bananas, canned or dried fruit)   *nuts and seeds, beans   *olives and olive oil   *small portions of lean meats  such as fish and white chicken    *small portions of whole grains  3)Get at least 150 minutes of sweaty aerobic exercise per week.  4)Reduce stress - consider counseling, meditation and relaxation to balance other aspects of your life.    WE NOW OFFER    [facility] FAST TRACK!!!  SAME DAY Appointments for ACUTE CARE  Such as: Sprains, Injuries, cuts, abrasions, rashes, muscle pain, joint pain, back pain Colds, flu, sore throats, headache, allergies, cough, fever  Ear pain, sinus and eye infections Abdominal pain, nausea, vomiting, diarrhea, upset stomach Animal/insect bites  3 Easy Ways to Schedule: Walk-In Scheduling Call in scheduling Mychart Sign-up: [email or web address]                     No Follow-up on file.   Colin Benton R., DO

## 2016-04-16 ENCOUNTER — Ambulatory Visit (INDEPENDENT_AMBULATORY_CARE_PROVIDER_SITE_OTHER): Payer: 59 | Admitting: Family Medicine

## 2016-04-16 ENCOUNTER — Encounter: Payer: Self-pay | Admitting: Family Medicine

## 2016-04-16 VITALS — BP 118/70 | HR 58 | Temp 98.2°F | Ht 67.5 in | Wt 162.4 lb

## 2016-04-16 DIAGNOSIS — G43809 Other migraine, not intractable, without status migrainosus: Secondary | ICD-10-CM | POA: Diagnosis not present

## 2016-04-16 DIAGNOSIS — D229 Melanocytic nevi, unspecified: Secondary | ICD-10-CM | POA: Diagnosis not present

## 2016-04-16 DIAGNOSIS — Z0000 Encounter for general adult medical examination without abnormal findings: Secondary | ICD-10-CM | POA: Diagnosis not present

## 2016-04-16 DIAGNOSIS — Z299 Encounter for prophylactic measures, unspecified: Secondary | ICD-10-CM

## 2016-04-16 DIAGNOSIS — G43909 Migraine, unspecified, not intractable, without status migrainosus: Secondary | ICD-10-CM | POA: Insufficient documentation

## 2016-04-16 LAB — LIPID PANEL
CHOL/HDL RATIO: 3
Cholesterol: 158 mg/dL (ref 0–200)
HDL: 50.8 mg/dL (ref >39.00)
LDL Cholesterol: 93 mg/dL (ref 0–99)
NonHDL: 107.24
Triglycerides: 73 mg/dL (ref 0.0–149.0)
VLDL: 14.6 mg/dL (ref 0.0–40.0)

## 2016-04-16 LAB — HEMOGLOBIN A1C: Hgb A1c MFr Bld: 5.5 % (ref 4.6–6.5)

## 2016-04-16 NOTE — Progress Notes (Signed)
Pre visit review using our clinic review tool, if applicable. No additional management support is needed unless otherwise documented below in the visit note. 

## 2016-04-16 NOTE — Patient Instructions (Signed)
BEFORE YOU LEAVE: -2 question depression screen in epic -cologard -follow up: 1 year for CPE -labs  We ordered the Cologuard test for colon cancer screening. Please complete this test promptly once the kit arrives. Please contact us if you have not received your kit in the next few weeks.  Vit D3 [phone] IU daily - Source naturals vit D3 drops, cosco or [employer]ma[employer] club vit d3  We have ordered labs or studies at this visit. It can take up to 1-2 weeks for results and processing. IF results require follow up or explanation, we will call you with instructions. Clinically stable results will be released to your [facility]. If you have not heard from [place] or cannot find your results in [facility] in 2 weeks please contact our office at [phone].  If you are not yet signed up for [facility], please consider signing up.   We recommend the following healthy lifestyle for LIFE: 1) Small portions.   Tip: eat off of a salad plate instead of a dinner plate.  Tip: if you need more or a snack choose fruits, veggies and/or a handful of nuts or seeds.  2) Eat a healthy clean diet.  * Tip: Avoid (less then 1 serving per week): processed foods, sweets, sweetened drinks, white starches (rice, flour, bread, potatoes, pasta, etc), red meat, fast foods, butter  *Tip: CHOOSE instead   * 5-9 servings per day of fresh or frozen fruits and vegetables (but not corn, potatoes, bananas, canned or dried fruit)   *nuts and seeds, beans   *olives and olive oil   *small portions of lean meats such as fish and white chicken    *small portions of whole grains  3)Get at least 150 minutes of sweaty aerobic exercise per week.  4)Reduce stress - consider counseling, meditation and relaxation to balance other aspects of your life.    WE NOW OFFER   [facility] FAST TRACK!!!  SAME DAY Appointments for ACUTE CARE  Such as: Sprains, Injuries, cuts, abrasions, rashes, muscle pain, joint pain, back pain Colds, flu, sore  throats, headache, allergies, cough, fever  Ear pain, sinus and eye infections Abdominal pain, nausea, vomiting, diarrhea, upset stomach Animal/insect bites  3 Easy Ways to Schedule: Walk-In Scheduling Call in scheduling Mychart Sign-up: [email or web address]

## 2016-04-22 ENCOUNTER — Telehealth (INDEPENDENT_AMBULATORY_CARE_PROVIDER_SITE_OTHER): Payer: Self-pay | Admitting: Radiology

## 2016-04-22 NOTE — Telephone Encounter (Signed)
John Carey left a voicemail requesting imaging of his right hip. He is aware CD has been made and is ready for pick-up.

## 2016-04-23 DIAGNOSIS — M1611 Unilateral primary osteoarthritis, right hip: Secondary | ICD-10-CM | POA: Diagnosis not present

## 2016-05-20 DIAGNOSIS — L812 Freckles: Secondary | ICD-10-CM | POA: Diagnosis not present

## 2016-05-20 DIAGNOSIS — L821 Other seborrheic keratosis: Secondary | ICD-10-CM | POA: Diagnosis not present

## 2016-05-20 DIAGNOSIS — C44612 Basal cell carcinoma of skin of right upper limb, including shoulder: Secondary | ICD-10-CM | POA: Diagnosis not present

## 2016-05-20 DIAGNOSIS — L570 Actinic keratosis: Secondary | ICD-10-CM | POA: Diagnosis not present

## 2016-05-20 DIAGNOSIS — Z85828 Personal history of other malignant neoplasm of skin: Secondary | ICD-10-CM | POA: Diagnosis not present

## 2016-05-20 DIAGNOSIS — L820 Inflamed seborrheic keratosis: Secondary | ICD-10-CM | POA: Diagnosis not present

## 2016-06-01 DIAGNOSIS — H524 Presbyopia: Secondary | ICD-10-CM | POA: Diagnosis not present

## 2016-06-07 DIAGNOSIS — G43019 Migraine without aura, intractable, without status migrainosus: Secondary | ICD-10-CM | POA: Diagnosis not present

## 2016-06-07 DIAGNOSIS — G43719 Chronic migraine without aura, intractable, without status migrainosus: Secondary | ICD-10-CM | POA: Diagnosis not present

## 2016-06-07 DIAGNOSIS — R51 Headache: Secondary | ICD-10-CM | POA: Diagnosis not present

## 2016-07-08 DIAGNOSIS — M1611 Unilateral primary osteoarthritis, right hip: Secondary | ICD-10-CM | POA: Diagnosis not present

## 2016-07-22 ENCOUNTER — Telehealth: Payer: Self-pay | Admitting: Family Medicine

## 2016-07-22 DIAGNOSIS — M1611 Unilateral primary osteoarthritis, right hip: Secondary | ICD-10-CM | POA: Diagnosis not present

## 2016-07-22 NOTE — Telephone Encounter (Signed)
Patient dropped off a [facility] Clearance form  Fax to: [phone] ATTN: Santiago Bur Disposition: Dr's Folder

## 2016-07-26 NOTE — Telephone Encounter (Signed)
Pt will need an appointment for medical clearance.  Please help schedule.  Thanks!!

## 2016-07-27 NOTE — Telephone Encounter (Signed)
° ° ° °  lmovm for pt to call back and schedule appt for medical clearance

## 2016-08-03 ENCOUNTER — Encounter: Payer: Self-pay | Admitting: Family Medicine

## 2016-08-03 ENCOUNTER — Ambulatory Visit (INDEPENDENT_AMBULATORY_CARE_PROVIDER_SITE_OTHER): Payer: 59 | Admitting: Family Medicine

## 2016-08-03 VITALS — BP 100/80 | HR 60 | Temp 97.6°F | Ht 67.5 in | Wt 167.5 lb

## 2016-08-03 DIAGNOSIS — R001 Bradycardia, unspecified: Secondary | ICD-10-CM

## 2016-08-03 DIAGNOSIS — R9431 Abnormal electrocardiogram [ECG] [EKG]: Secondary | ICD-10-CM | POA: Diagnosis not present

## 2016-08-03 DIAGNOSIS — Z01818 Encounter for other preprocedural examination: Secondary | ICD-10-CM

## 2016-08-03 DIAGNOSIS — M25551 Pain in right hip: Secondary | ICD-10-CM

## 2016-08-03 NOTE — Progress Notes (Signed)
No chief complaint on file.   HPI:  Patient is seen for optimization of general medical care prior to surgery. Surgery type: R total hip - interfering with activities, pain even with walking Date of surgery: end of august possibly, TBD - seeing Dr. Swinteck  Kidney disease? None except remote kidney stone Prior surgeries/Issues following anesthesia? Repair of achilles, no complications, did well Hx MI, heart arrythmia, CHF, angina or stroke? none Epilepsy or Seizures? none Arthritis or problems with neck or jaw? none Thyroid disease? none Liver disease? none Asthma, COPD or chronic lung disease? none Diabetes? none (Needs to be evaluated by anesthesia if yes to these questions.)  Other: Poor nutrition, Frail or other: no  METS:  ?Can take care of self, such as eat, dress, or use the toilet (1 MET). yes ?Can walk up a flight of steps or a hill (4 METs).yes ?Can do heavy work around the house such as scrubbing floors or lifting or moving heavy furniture (between 4 and 10 METs). yes ?Can participate in strenuous sports such as swimming, singles tennis, football, basketball, and skiing (>10 METs) yes . AHA Risks: Major predictors that require intensive management and may lead to delay in or cancellation of the operative procedure unless emergent: NONE  . Unstable coronary syndromes including unstable or severe angina or recent MI  . Decompensated heart failure including NYHA functional class IV or worsening or new-onset HF  . Significant arrhythmias including high grade AV block, symptomatic ventricular arrhythmias, supraventricular arrhythmias with ventricular rate >100 bpm at rest, symptomatic bradycardia, and newly recognized ventricular tachycardia  . Severe heart valve disease including severe aortic stenosis or symptomatic mitral stenosis   . History of ischemic heart disease . History of cerebrovascular disease  Other clinical predictors that warrant careful assessment of  current status: NONE  . History of compensated heart failure or prior heart failure  . Diabetes mellitus  . Renal insufficiency  Type of surgery and Risk: 1) High risk (reported risk of cardiac death or nonfatal myocardial infarction [MI] often greater than 5 percent):  . Aortic and other major vascular surgery  . Peripheral artery surgery   2)Intermediate risk (reported risk of cardiac death or nonfatal MI generally 1 to 5 percent):  . Carotid endarterectomy  . Head and neck surgery  . Intraperitoneal and intrathoracic surgery  . Orthopedic surgery  . Prostate surgery   3)Low risk (reported risk of cardiac death or nonfatal MI generally less than 1 percent):  . Ambulatory surgery  . Endoscopic procedures  . Superficial procedure  . Cataract surgery  . Breast surgery  Medications that need to be addressed prior to surgery: None Discontinue acei/arbs/non-statin lipid lowering drugs day of surgery ASA stop 7 days before or discuss with cardiology if CV risks, other anticoagulants discuss with cardiology.  ROS: See pertinent positives and negatives per HPI. 11 point ROS negative except where noted.  Past Medical History:  Diagnosis Date  . Migraine     Past Surgical History:  Procedure Laterality Date  . KIDNEY STONE SURGERY     right-open removal    Family History  Problem Relation Age of Onset  . Parkinsonism Mother   . Hypertension Father   . Prostate cancer Father 75  . Diabetes Neg Hx     Social History   Social History  . Marital status: Married    Spouse name: N/A  . Number of children: N/A  . Years of education: N/A   Social History Main Topics  .   Smoking status: Never Smoker  . Smokeless tobacco: Never Used  . Alcohol use Yes     Comment: social  . Drug use: No  . Sexual activity: Not Asked   Other Topics Concern  . None   Social History Narrative  . None     Current Outpatient Prescriptions:  .  eletriptan (RELPAX) 40 MG tablet, One  tablet by mouth as needed for migraine headache.  If the headache improves and then returns, dose may be repeated after 2 hours have elapsed since first dose (do not exceed 80 mg per day). may repeat in 2 hours if necessary, per Dr Freeman , Disp: , Rfl:   EXAM:  Vitals:   08/03/16 1316 08/03/16 1407  BP: 100/80   Pulse: (!) 49 60  Temp: 97.6 F (36.4 C)     Body mass index is 25.85 kg/m.  GENERAL: vitals reviewed and listed above, alert, oriented, appears well hydrated and in no acute distress  HEENT: atraumatic, conjunttiva clear, no obvious abnormalities on inspection of external nose and ears  NECK: no obvious masses on inspection, no carotid bruits  LUNGS: clear to auscultation bilaterally, no wheezes, rales or rhonchi, good air movement  CV: brady, no peripheral edema, no JVD, BP normal range, normal radial pulses  MS: moves all extremities without noticeable abnormality  PSYCH: pleasant and cooperative, no obvious depression or anxiety  ASSESSMENT AND PLAN:  Discussed the following assessment and plan:  Bradycardia - Plan: EKG 12-Lead, EKG 12-Lead, Ambulatory referral to Cardiology  Pre-op evaluation - Plan: Ambulatory referral to Cardiology  Abnormal EKG  Pain of right hip joint  Assessment: -HR low on intake, low end of normal on my exam. EKG shows brady,  does cross-fit several days per week and this may be fitness related. However, HR lower then in the past - around 49 today.Asymptomatic. However, would advise cardiology eval prior to surgery and referral placed.  -Risk factors: none -Surgery Risks:intermediate -age, nutritional status, fraility: good nutritional status, age <65, no fraility -functional capacity: > 8 METs wethout symptoms -comorbidities: none, no medications Patient Specific Risks: patient is low risk for intermediate risks surgery   Recommendations for optimizing general medical care prior to surgery: -cardiology eval prior to surgery  for cardiac clearance - will send referral and note to surgergy office -advised patient to discuss specific risks morbidity and mortality of surgery with surgeon, CV risks discussed with patient -advised patient will defer to surgeon for post-op DVT prophylaxis and post op care -form for pre-op optimization of general medical care prior to surgery faxed to surgeon office  -Patient advised to return or notify a doctor immediately if symptoms worsen or persist or new concerns arise.  Patient Instructions  -We placed a referral for you as discussed for cardiac clearance prior to surgery . It usually takes about 1-2 weeks to process and schedule this referral. If you have not heard from us regarding this appointment in 2 weeks please contact our office.     KIM, HANNAH R.   

## 2016-08-03 NOTE — Patient Instructions (Signed)
-  We placed a referral for you as discussed for cardiac clearance prior to surgery . It usually takes about 1-2 weeks to process and schedule this referral. If you have not heard from [place] regarding this appointment in 2 weeks please contact our office.

## 2016-08-29 NOTE — Progress Notes (Signed)
Cardiology Office Note    Date:  08/31/2016   ID:  Edgerrin, Correia 1965/03/02, MRN [id number]  PCP:  Lucretia Kern, DO  Cardiologist: New to [facility] - Dr. Claiborne Billings  Chief Complaint  Patient presents with  . New Patient (Initial Visit)    Referral for Bradycardia and Cardiac Clearance    History of Present Illness:    John Carey is a 51 y.o. male with past medical history of nephrolithiasis and no prior cardiac history who presents to the office today for evaluation of bradycardia at the request of Dr. Colin Benton.   He was evaluated by his PCP on 08/03/2016 for cardiac clearance in regards to an upcoming right hip replacement. An EKG was obtained at that time and showed sinus bradycardia, HR 49, with left atrial enlargement, therefore Cardiology evaluation was recommended prior to his surgery.   In talking with the patient today, he reports being very active at baseline and participates in "Cross-Fit type" classes multiple times per week. He is able to perform household chores and yard work without any anginal symptoms as well. He denies any history of chest discomfort or dyspnea on exertion. No recent orthopnea, PND, lower extremity edema, palpitations, lightheadedness, dizziness, or presyncope.  Reports his heart rate is usually in the 50's to 60's but that he started exercising more aggressively in 02/2016 and his heart rate has been slightly lower since then. He denies any symptoms at the time his heart rate was in the high-40's at his PCPs visit.  He denies any history of CAD, HTN, HLD, or Type 2 DM. Was told as a teenager he had MVP but in his 20's he was told this had resolved and he did not need further cardiac evaluation. Does have a family history of CAD with his mother having an MI in her 82's and paternal grandfather having an MI at age 62 (heavy alcohol and tobacco use). He denies any personal history of tobacco use or recreational drug use. Does consume alcohol socially.     Past Medical History:  Diagnosis Date  . Migraine     Past Surgical History:  Procedure Laterality Date  . [place]   right-open removal    Current Medications: Outpatient Medications Prior to Visit  Medication Sig Dispense Refill  . eletriptan (RELPAX) 40 MG tablet One tablet by mouth as needed for migraine headache.  If the headache improves and then returns, dose may be repeated after 2 hours have elapsed since first dose (do not exceed 80 mg per day). may repeat in 2 hours if necessary, per Dr Domingo Cocking      No facility-administered medications prior to visit.      Allergies:   Patient has no known allergies.   Social History   Social History  . Marital status: Married    Spouse name: N/A  . Number of children: N/A  . Years of education: N/A   Social History Main Topics  . Smoking status: Never Smoker  . Smokeless tobacco: Never Used  . Alcohol use Yes     Comment: social  . Drug use: No  . Sexual activity: Not Asked   Other Topics Concern  . None   Social History Narrative  . None     Family History:  The patient's family history includes CAD in his mother and paternal grandfather; Hypertension in his father; Parkinsonism in his mother; Prostate cancer (age of onset: 53) in his father.  Review of Systems:   Please see the history of present illness.     General:  No chills, fever, night sweats or weight changes.  Cardiovascular:  No chest pain, dyspnea on exertion, edema, orthopnea, palpitations, paroxysmal nocturnal dyspnea. Dermatological: No rash, lesions/masses Respiratory: No cough, dyspnea Urologic: No hematuria, dysuria MSK: Positive for right hip pain.  Abdominal:   No nausea, vomiting, diarrhea, bright red blood per rectum, melena, or hematemesis Neurologic:  No visual changes, wkns, changes in mental status.  All other systems reviewed and are otherwise negative except as noted above.   Physical Exam:    VS:  BP  122/86   Pulse 67   Ht 5\' 8"  (1.727 m)   Wt 163 lb 9.6 oz (74.2 kg)   SpO2 96%   BMI 24.88 kg/m    General: Well developed, well nourished Caucasian male appearing in no acute distress. Head: Normocephalic, atraumatic, sclera non-icteric, no xanthomas, nares are without discharge.  Neck: No carotid bruits. JVD not elevated.  Lungs: Respirations regular and unlabored, without wheezes or rales.  Heart: Regular rate and rhythm. No S3 or S4.  No murmur, no rubs, or gallops appreciated. Abdomen: Soft, non-tender, non-distended with normoactive bowel sounds. No hepatomegaly. No rebound/guarding. No obvious abdominal masses. Msk:  Strength and tone appear normal for age. No joint deformities or effusions. Extremities: No clubbing or cyanosis. No lower extremity edema.  Distal pedal pulses are 2+ bilaterally. Neuro: Alert and oriented X 3. Moves all extremities spontaneously. No focal deficits noted. Psych:  Responds to questions appropriately with a normal affect. Skin: No rashes or lesions noted  Wt Readings from Last 3 Encounters:  08/31/16 163 lb 9.6 oz (74.2 kg)  08/03/16 167 lb 8 oz (76 kg)  04/16/16 162 lb 6.4 oz (73.7 kg)     Studies/Labs Reviewed:   EKG:  EKG is not ordered today.   Recent Labs: No results found for requested labs within last 8760 hours.   Lipid Panel    Component Value Date/Time   CHOL 158 04/16/2016 0902   TRIG 73.0 04/16/2016 0902   TRIG 63 12/10/2005 0955   HDL 50.80 04/16/2016 0902   CHOLHDL 3 04/16/2016 0902   VLDL 14.6 04/16/2016 0902   LDLCALC 93 04/16/2016 0902    Additional studies/ records that were reviewed today include:   EKG: 08/03/2016: Sinus bradycardia, HR 49, with left atrial enlargement.  Assessment:    1. Preoperative cardiovascular examination   2. Sinus bradycardia      Plan:   In order of problems listed above:  1. Preoperative Cardiac Clearance - he is planning to undergo right total hip replacement and was referred  to Cardiology by his PCP after being found to have bradycardia. - he denies any known history of CAD, HTN, HLD, Type 2 DM or prior tobacco use. - he is very active at baseline and participates in Cross-fit activities multiple times per week without any anginal symptoms. - recent EKG showed sinus bradycardia with no acute ST or T-wave changes. HR is improved into the 60's today.  - he is overall low-risk from a cardiac perspective for his upcoming surgery. No further cardiac testing is indicated at this time. This was discussed with Dr. Claiborne Billings (DOD) who was in agreement with the assessment and plan.   2. Sinus Bradycardia - recent EKG showed sinus bradycardia with HR of 49. No evidence of CHB. HR is in the 60's during his visit today. - he is asymptomatic with this.  He exercises routinely which likely contributes to his slower resting HR. Would avoid AV nodal blocking agents.     Medication Adjustments/Labs and Tests Ordered: Current medicines are reviewed at length with the patient today.  Concerns regarding medicines are outlined above.  Medication changes, Labs and Tests ordered today are listed in the Patient Instructions below. Patient Instructions  Medication Instructions: Your physician recommends that you continue on your current medications as directed. Please refer to the Current Medication list given to you today.  Follow-Up: Your physician wants you to follow-up as needed.   Special Instructions:  *You have been cleared for surgery.  Thank you for choosing Heartcare at [facility]!!    Signed, Erma Heritage, PA-C  08/31/2016 11:59 AM    [address], [place], [place]  [address] Phone: [phone]; Fax: [phone]  [address] [place], [place] [address] Phone: [phone]

## 2016-08-31 ENCOUNTER — Encounter: Payer: Self-pay | Admitting: Student

## 2016-08-31 ENCOUNTER — Ambulatory Visit (INDEPENDENT_AMBULATORY_CARE_PROVIDER_SITE_OTHER): Payer: 59 | Admitting: Student

## 2016-08-31 VITALS — BP 122/86 | HR 67 | Ht 68.0 in | Wt 163.6 lb

## 2016-08-31 DIAGNOSIS — Z01810 Encounter for preprocedural cardiovascular examination: Secondary | ICD-10-CM

## 2016-08-31 DIAGNOSIS — R001 Bradycardia, unspecified: Secondary | ICD-10-CM | POA: Diagnosis not present

## 2016-08-31 NOTE — Patient Instructions (Signed)
Medication Instructions: Your physician recommends that you continue on your current medications as directed. Please refer to the Current Medication list given to you today.   Follow-Up: Your physician wants you to follow-up as needed.    Special Instructions:  *You have been cleared for surgery.    Thank you for choosing Heartcare at [facility]!!

## 2016-09-02 ENCOUNTER — Ambulatory Visit: Payer: Self-pay | Admitting: Orthopedic Surgery

## 2016-09-08 ENCOUNTER — Ambulatory Visit: Payer: Self-pay | Admitting: Orthopedic Surgery

## 2016-09-08 NOTE — H&P (Signed)
TOTAL HIP ADMISSION H&P  Patient is admitted for right total hip arthroplasty.  Subjective:  Chief Complaint: right hip pain  HPI: John Carey, 51 y.o. male, has a history of pain and functional disability in the right hip(s) due to arthritis and patient has failed non-surgical conservative treatments for greater than 12 weeks to include NSAID's and/or analgesics, corticosteriod injections, flexibility and strengthening excercises, use of assistive devices, weight reduction as appropriate and activity modification.  Onset of symptoms was gradual starting 4 years ago with gradually worsening course since that time.The patient noted no past surgery on the right hip(s).  Patient currently rates pain in the right hip at 10 out of 10 with activity. Patient has night pain, worsening of pain with activity and weight bearing, pain that interfers with activities of daily living, pain with passive range of motion and crepitus. Patient has evidence of subchondral cysts, subchondral sclerosis, periarticular osteophytes and joint space narrowing by imaging studies. This condition presents safety issues increasing the risk of falls.  There is no current active infection.  Patient Active Problem List   Diagnosis Date Noted  . Sinus bradycardia 08/31/2016  . Preoperative cardiovascular examination 08/31/2016  . Numerous moles 04/16/2016  . Migraine 04/16/2016  . Raynauds phenomenon 07/27/2011   Past Medical History:  Diagnosis Date  . Migraine     Past Surgical History:  Procedure Laterality Date  . KIDNEY STONE SURGERY  1985   right-open removal     (Not in a hospital admission) No Known Allergies  Social History  Substance Use Topics  . Smoking status: Never Smoker  . Smokeless tobacco: Never Used  . Alcohol use Yes     Comment: social    Family History  Problem Relation Age of Onset  . Parkinsonism Mother   . CAD Mother        70's  . Hypertension Father   . Prostate cancer Father 75   . CAD Paternal Grandfather        Massive MI at age 42 - heavy alcohol and tobacco use  . Diabetes Neg Hx      Review of Systems  Constitutional: Negative.   HENT: Negative.   Eyes: Negative.   Respiratory: Negative.   Cardiovascular: Negative.   Gastrointestinal: Negative.   Genitourinary: Negative.   Musculoskeletal: Negative.   Skin: Negative.   Neurological: Negative.   Endo/Heme/Allergies: Negative.   Psychiatric/Behavioral: Negative.     Objective:  Physical Exam  Vitals reviewed. Constitutional: He is oriented to person, place, and time. He appears well-developed and well-nourished.  HENT:  Head: Normocephalic and atraumatic.  Eyes: Pupils are equal, round, and reactive to light. Conjunctivae and EOM are normal.  Neck: Normal range of motion. Neck supple.  Cardiovascular: Normal rate, regular rhythm and intact distal pulses.   Respiratory: Effort normal. No respiratory distress.  GI: Soft. He exhibits no distension.  Genitourinary:  Genitourinary Comments: deferred  Musculoskeletal:       Right hip: He exhibits decreased range of motion, decreased strength and bony tenderness.  Neurological: He is alert and oriented to person, place, and time. He has normal reflexes.  Skin: Skin is warm and dry.  Psychiatric: He has a normal mood and affect. His behavior is normal. Judgment and thought content normal.    Vital signs in last 24 hours: @VSRANGES@  Labs:   Estimated body mass index is 24.88 kg/m as calculated from the following:   Height as of 08/31/16: 5' 8" (1.727 m).     Weight as of 08/31/16: 74.2 kg (163 lb 9.6 oz).   Imaging Review Plain radiographs demonstrate severe degenerative joint disease of the right hip(s). The bone quality appears to be adequate for age and reported activity level.  Assessment/Plan:  End stage arthritis, right hip(s)  The patient history, physical examination, clinical judgement of the provider and imaging studies are  consistent with end stage degenerative joint disease of the right hip(s) and total hip arthroplasty is deemed medically necessary. The treatment options including medical management, injection therapy, arthroscopy and arthroplasty were discussed at length. The risks and benefits of total hip arthroplasty were presented and reviewed. The risks due to aseptic loosening, infection, stiffness, dislocation/subluxation,  thromboembolic complications and other imponderables were discussed.  The patient acknowledged the explanation, agreed to proceed with the plan and consent was signed. Patient is being admitted for inpatient treatment for surgery, pain control, PT, OT, prophylactic antibiotics, VTE prophylaxis, progressive ambulation and ADL's and discharge planning.The patient is planning to be discharged home with HEP 

## 2016-09-17 NOTE — Pre-Procedure Instructions (Signed)
John Carey  09/17/2016      [facility], [place] [address] Phone: [phone] Fax: [phone]    Your procedure is scheduled on August 27  Report to [place] at [place].M.  Call this number if you have problems the morning of surgery:  [phone]   Remember:  Do not eat food or drink liquids after midnight.   Take these medicines the morning of surgery with A SIP OF WATER Milnacipran (SAVELLA)  7 days prior to surgery STOP taking any Aspirin, Aleve, Naproxen, Ibuprofen, Motrin, Advil, Goody's, BC's, all herbal medications, fish oil, and all vitamins    Do not wear jewelry  Do not wear lotions, powders, or cologne, or deoderant.  Do not shave 48 hours prior to surgery.  Men may shave face and neck.  Do not bring valuables to the hospital.  [facility] is not responsible for any belongings or valuables.  Contacts, dentures or bridgework may not be worn into surgery.  Leave your suitcase in the car.  After surgery it may be brought to your room.  For patients admitted to the hospital, discharge time will be determined by your treatment team.  Patients discharged the day of surgery will not be allowed to drive home.    Special instructions:   [place]- Preparing For Surgery  Before surgery, you can play an important role. Because skin is not sterile, your skin needs to be as free of germs as possible. You can reduce the number of germs on your skin by washing with CHG (chlorahexidine gluconate) Soap before surgery.  CHG is an antiseptic cleaner which kills germs and bonds with the skin to continue killing germs even after washing.  Please do not use if you have an allergy to CHG or antibacterial soaps. If your skin becomes reddened/irritated stop using the CHG.  Do not shave (including legs and underarms) for at least 48 hours prior to first CHG shower.  It is OK to shave your face.  Please follow these instructions carefully.   1. Shower the NIGHT BEFORE SURGERY and the MORNING OF SURGERY with CHG.   2. If you chose to wash your hair, wash your hair first as usual with your normal shampoo.  3. After you shampoo, rinse your hair and body thoroughly to remove the shampoo.  4. Use CHG as you would any other liquid soap. You can apply CHG directly to the skin and wash gently with a scrungie or a clean washcloth.   5. Apply the CHG Soap to your body ONLY FROM THE NECK DOWN.  Do not use on open wounds or open sores. Avoid contact with your eyes, ears, mouth and genitals (private parts). Wash genitals (private parts) with your normal soap.  6. Wash thoroughly, paying special attention to the area where your surgery will be performed.  7. Thoroughly rinse your body with warm water from the neck down.  8. DO NOT shower/wash with your normal soap after using and rinsing off the CHG Soap.  9. Pat yourself dry with a CLEAN TOWEL.   10. Wear CLEAN PAJAMAS   11. Place CLEAN SHEETS on your bed the night of your first shower and DO NOT SLEEP WITH PETS.    Day of Surgery: Do not apply any deodorants/lotions. Please wear clean clothes to the hospital/surgery center.      Please read over  the following fact sheets that you were given.

## 2016-09-20 ENCOUNTER — Encounter (HOSPITAL_COMMUNITY): Payer: Self-pay

## 2016-09-20 ENCOUNTER — Encounter (HOSPITAL_COMMUNITY)
Admission: RE | Admit: 2016-09-20 | Discharge: 2016-09-20 | Disposition: A | Discharge status: Home / Self Care | Payer: 59 | Source: Ambulatory Visit | Attending: Orthopedic Surgery | Admitting: Orthopedic Surgery

## 2016-09-20 DIAGNOSIS — Z01818 Encounter for other preprocedural examination: Secondary | ICD-10-CM | POA: Insufficient documentation

## 2016-09-20 DIAGNOSIS — M1611 Unilateral primary osteoarthritis, right hip: Secondary | ICD-10-CM | POA: Insufficient documentation

## 2016-09-20 HISTORY — DX: Personal history of urinary calculi: Z87.442

## 2016-09-20 LAB — TYPE AND SCREEN
ABO/RH(D): O POS
Antibody Screen: NEGATIVE

## 2016-09-20 LAB — CBC
HCT: 42.6 % (ref 39.0–52.0)
Hemoglobin: 14.7 g/dL (ref 13.0–17.0)
MCH: 31.2 pg (ref 26.0–34.0)
MCHC: 34.5 g/dL (ref 30.0–36.0)
MCV: 90.4 fL (ref 78.0–100.0)
Platelets: 263 10*3/uL (ref 150–400)
RBC: 4.71 MIL/uL (ref 4.22–5.81)
RDW: 12.7 % (ref 11.5–15.5)
WBC: 5.9 10*3/uL (ref 4.0–10.5)

## 2016-09-20 LAB — BASIC METABOLIC PANEL
ANION GAP: 5 (ref 5–15)
BUN: 10 mg/dL (ref 6–20)
CALCIUM: 9.4 mg/dL (ref 8.9–10.3)
CO2: 30 mmol/L (ref 22–32)
Chloride: 103 mmol/L (ref 101–111)
Creatinine, Ser: 1.02 mg/dL (ref 0.61–1.24)
GLUCOSE: 97 mg/dL (ref 65–99)
Potassium: 4.4 mmol/L (ref 3.5–5.1)
Sodium: 138 mmol/L (ref 135–145)

## 2016-09-20 LAB — SURGICAL PCR SCREEN
MRSA, PCR: NEGATIVE
Staphylococcus aureus: NEGATIVE

## 2016-09-20 LAB — ABO/RH: ABO/RH(D): O POS

## 2016-09-24 MED ORDER — ACETAMINOPHEN 10 MG/ML IV SOLN
1000.0000 mg | INTRAVENOUS | Status: AC
  Administered 2016-09-27: 1000 mg via INTRAVENOUS
  Filled 2016-09-24: qty 100

## 2016-09-24 MED ORDER — CEFAZOLIN SODIUM-DEXTROSE 2-4 GM/100ML-% IV SOLN
2.0000 g | INTRAVENOUS | Status: AC
  Administered 2016-09-27: 2 g via INTRAVENOUS
  Filled 2016-09-24: qty 100

## 2016-09-24 MED ORDER — SODIUM CHLORIDE 0.9 % IV SOLN
1000.0000 mg | INTRAVENOUS | Status: AC
  Administered 2016-09-27: 1000 mg via INTRAVENOUS
  Filled 2016-09-24: qty 1100

## 2016-09-24 MED ORDER — SODIUM CHLORIDE 0.9 % IV SOLN: INTRAVENOUS | Status: DC

## 2016-09-27 ENCOUNTER — Inpatient Hospital Stay (HOSPITAL_COMMUNITY): Payer: 59 | Admitting: Certified Registered Nurse Anesthetist

## 2016-09-27 ENCOUNTER — Encounter (HOSPITAL_COMMUNITY)
Admission: RE | Disposition: A | Discharge status: Home / Self Care | Payer: Self-pay | Source: Ambulatory Visit | Attending: Orthopedic Surgery

## 2016-09-27 ENCOUNTER — Inpatient Hospital Stay (HOSPITAL_COMMUNITY): Payer: 59

## 2016-09-27 ENCOUNTER — Inpatient Hospital Stay (HOSPITAL_COMMUNITY)
Admission: RE | Admit: 2016-09-27 | Discharge: 2016-09-28 | DRG: 470 | Disposition: A | Discharge status: Home / Self Care | Payer: 59 | Source: Ambulatory Visit | Attending: Orthopedic Surgery | Admitting: Orthopedic Surgery

## 2016-09-27 ENCOUNTER — Encounter (HOSPITAL_COMMUNITY): Payer: Self-pay

## 2016-09-27 DIAGNOSIS — M1711 Unilateral primary osteoarthritis, right knee: Secondary | ICD-10-CM | POA: Diagnosis not present

## 2016-09-27 DIAGNOSIS — M25551 Pain in right hip: Secondary | ICD-10-CM | POA: Diagnosis not present

## 2016-09-27 DIAGNOSIS — Z96641 Presence of right artificial hip joint: Secondary | ICD-10-CM | POA: Diagnosis not present

## 2016-09-27 DIAGNOSIS — M1611 Unilateral primary osteoarthritis, right hip: Principal | ICD-10-CM | POA: Diagnosis present

## 2016-09-27 DIAGNOSIS — Z471 Aftercare following joint replacement surgery: Secondary | ICD-10-CM | POA: Diagnosis not present

## 2016-09-27 DIAGNOSIS — R001 Bradycardia, unspecified: Secondary | ICD-10-CM | POA: Diagnosis not present

## 2016-09-27 DIAGNOSIS — Z09 Encounter for follow-up examination after completed treatment for conditions other than malignant neoplasm: Secondary | ICD-10-CM

## 2016-09-27 DIAGNOSIS — Z419 Encounter for procedure for purposes other than remedying health state, unspecified: Secondary | ICD-10-CM

## 2016-09-27 HISTORY — PX: TOTAL HIP ARTHROPLASTY: SHX124

## 2016-09-27 SURGERY — ARTHROPLASTY, HIP, TOTAL, ANTERIOR APPROACH
Anesthesia: Monitor Anesthesia Care | Site: Hip | Laterality: Right

## 2016-09-27 MED ORDER — HYDROMORPHONE HCL 1 MG/ML IJ SOLN
0.5000 mg | INTRAMUSCULAR | Status: DC | PRN
  Administered 2016-09-27: 1 mg via INTRAVENOUS
  Filled 2016-09-27: qty 1

## 2016-09-27 MED ORDER — MIDAZOLAM HCL 5 MG/5ML IJ SOLN
INTRAMUSCULAR | Status: DC | PRN
  Administered 2016-09-27: 2 mg via INTRAVENOUS

## 2016-09-27 MED ORDER — EPHEDRINE 5 MG/ML INJ
INTRAVENOUS | Status: AC
  Filled 2016-09-27: qty 10

## 2016-09-27 MED ORDER — PROPOFOL 10 MG/ML IV BOLUS
INTRAVENOUS | Status: AC
  Filled 2016-09-27: qty 20

## 2016-09-27 MED ORDER — HYDROCODONE-ACETAMINOPHEN 5-325 MG PO TABS
1.0000 | ORAL_TABLET | ORAL | Status: DC | PRN
  Administered 2016-09-27 – 2016-09-28 (×5): 2 via ORAL
  Filled 2016-09-27 (×5): qty 2

## 2016-09-27 MED ORDER — SODIUM CHLORIDE 0.9 % IV SOLN
INTRAVENOUS | Status: DC
  Administered 2016-09-27: 10:00:00 via INTRAVENOUS

## 2016-09-27 MED ORDER — KETOROLAC TROMETHAMINE 15 MG/ML IJ SOLN
15.0000 mg | Freq: Four times a day (QID) | INTRAMUSCULAR | Status: AC
  Administered 2016-09-27 – 2016-09-28 (×3): 15 mg via INTRAVENOUS
  Filled 2016-09-27 (×3): qty 1

## 2016-09-27 MED ORDER — MEPERIDINE HCL 25 MG/ML IJ SOLN: 6.2500 mg | INTRAMUSCULAR | Status: DC | PRN

## 2016-09-27 MED ORDER — DEXAMETHASONE SODIUM PHOSPHATE 10 MG/ML IJ SOLN
10.0000 mg | Freq: Once | INTRAMUSCULAR | Status: AC
  Administered 2016-09-28: 10 mg via INTRAVENOUS
  Filled 2016-09-27: qty 1

## 2016-09-27 MED ORDER — ASPIRIN 81 MG PO CHEW
81.0000 mg | CHEWABLE_TABLET | Freq: Two times a day (BID) | ORAL | Status: DC
  Administered 2016-09-27 – 2016-09-28 (×2): 81 mg via ORAL
  Filled 2016-09-27 (×2): qty 1

## 2016-09-27 MED ORDER — 0.9 % SODIUM CHLORIDE (POUR BTL) OPTIME
TOPICAL | Status: DC | PRN
  Administered 2016-09-27: 1000 mL

## 2016-09-27 MED ORDER — POVIDONE-IODINE 10 % EX SWAB: 2.0000 "application " | Freq: Once | CUTANEOUS | Status: DC

## 2016-09-27 MED ORDER — BUPIVACAINE HCL (PF) 0.5 % IJ SOLN
INTRAMUSCULAR | Status: AC
  Filled 2016-09-27: qty 10

## 2016-09-27 MED ORDER — CHLORHEXIDINE GLUCONATE 4 % EX LIQD: 60.0000 mL | Freq: Once | CUTANEOUS | Status: DC

## 2016-09-27 MED ORDER — METHOCARBAMOL 500 MG PO TABS
500.0000 mg | ORAL_TABLET | Freq: Four times a day (QID) | ORAL | Status: DC | PRN
  Administered 2016-09-27 (×2): 500 mg via ORAL
  Filled 2016-09-27 (×2): qty 1

## 2016-09-27 MED ORDER — FENTANYL CITRATE (PF) 100 MCG/2ML IJ SOLN
INTRAMUSCULAR | Status: DC | PRN
  Administered 2016-09-27: 100 ug via INTRAVENOUS

## 2016-09-27 MED ORDER — PROPOFOL 500 MG/50ML IV EMUL
INTRAVENOUS | Status: DC | PRN
  Administered 2016-09-27: 50 ug/kg/min via INTRAVENOUS

## 2016-09-27 MED ORDER — ONDANSETRON HCL 4 MG/2ML IJ SOLN
4.0000 mg | Freq: Four times a day (QID) | INTRAMUSCULAR | Status: DC | PRN
Start: 2016-09-27 — End: 2016-09-28

## 2016-09-27 MED ORDER — DEXTROSE 5 % IV SOLN: 500.0000 mg | Freq: Four times a day (QID) | INTRAVENOUS | Status: DC | PRN

## 2016-09-27 MED ORDER — SENNA 8.6 MG PO TABS
2.0000 | ORAL_TABLET | Freq: Every day | ORAL | Status: DC
  Administered 2016-09-27: 17.2 mg via ORAL
  Filled 2016-09-27: qty 2

## 2016-09-27 MED ORDER — ONDANSETRON HCL 4 MG/2ML IJ SOLN: 4.0000 mg | Freq: Once | INTRAMUSCULAR | Status: DC | PRN

## 2016-09-27 MED ORDER — BUPIVACAINE HCL (PF) 0.5 % IJ SOLN
INTRAMUSCULAR | Status: DC | PRN
  Administered 2016-09-27: 3 mL via INTRATHECAL

## 2016-09-27 MED ORDER — SODIUM CHLORIDE 0.9 % IJ SOLN
INTRAMUSCULAR | Status: DC | PRN
Start: 2016-09-27 — End: 2016-09-27
  Administered 2016-09-27: 29 mL

## 2016-09-27 MED ORDER — DEXAMETHASONE SODIUM PHOSPHATE 10 MG/ML IJ SOLN
INTRAMUSCULAR | Status: AC
  Filled 2016-09-27: qty 2

## 2016-09-27 MED ORDER — BUPIVACAINE-EPINEPHRINE (PF) 0.5% -1:200000 IJ SOLN
INTRAMUSCULAR | Status: DC | PRN
  Administered 2016-09-27: 30 mL

## 2016-09-27 MED ORDER — PROPOFOL 10 MG/ML IV BOLUS
INTRAVENOUS | Status: DC | PRN
  Administered 2016-09-27: 10 mg via INTRAVENOUS
  Administered 2016-09-27 (×2): 20 mg via INTRAVENOUS

## 2016-09-27 MED ORDER — DOCUSATE SODIUM 100 MG PO CAPS
100.0000 mg | ORAL_CAPSULE | Freq: Two times a day (BID) | ORAL | Status: DC
  Administered 2016-09-27 – 2016-09-28 (×3): 100 mg via ORAL
  Filled 2016-09-27 (×3): qty 1

## 2016-09-27 MED ORDER — KETOROLAC TROMETHAMINE 30 MG/ML IJ SOLN
INTRAMUSCULAR | Status: DC | PRN
  Administered 2016-09-27: 30 mg via INTRAVENOUS

## 2016-09-27 MED ORDER — MIDAZOLAM HCL 2 MG/2ML IJ SOLN
INTRAMUSCULAR | Status: AC
Start: 2016-09-27 — End: ?
  Filled 2016-09-27: qty 2

## 2016-09-27 MED ORDER — CEFAZOLIN SODIUM-DEXTROSE 2-4 GM/100ML-% IV SOLN
2.0000 g | Freq: Four times a day (QID) | INTRAVENOUS | Status: AC
  Administered 2016-09-27 (×2): 2 g via INTRAVENOUS
  Filled 2016-09-27 (×2): qty 100

## 2016-09-27 MED ORDER — SODIUM CHLORIDE 0.9 % IR SOLN
Status: DC | PRN
  Administered 2016-09-27: 1000 mL
  Administered 2016-09-27: 3000 mL

## 2016-09-27 MED ORDER — MENTHOL 3 MG MT LOZG: 1.0000 | LOZENGE | OROMUCOSAL | Status: DC | PRN

## 2016-09-27 MED ORDER — FENTANYL CITRATE (PF) 250 MCG/5ML IJ SOLN
INTRAMUSCULAR | Status: AC
  Filled 2016-09-27: qty 5

## 2016-09-27 MED ORDER — MILNACIPRAN HCL 50 MG PO TABS
50.0000 mg | ORAL_TABLET | Freq: Every day | ORAL | Status: DC
  Administered 2016-09-27 – 2016-09-28 (×2): 50 mg via ORAL
  Filled 2016-09-27 (×2): qty 1

## 2016-09-27 MED ORDER — ACETAMINOPHEN 325 MG PO TABS: 650.0000 mg | ORAL_TABLET | Freq: Four times a day (QID) | ORAL | Status: DC | PRN

## 2016-09-27 MED ORDER — SUCCINYLCHOLINE CHLORIDE 200 MG/10ML IV SOSY
PREFILLED_SYRINGE | INTRAVENOUS | Status: AC
  Filled 2016-09-27: qty 10

## 2016-09-27 MED ORDER — POLYETHYLENE GLYCOL 3350 17 G PO PACK: 17.0000 g | PACK | Freq: Every day | ORAL | Status: DC | PRN

## 2016-09-27 MED ORDER — ONDANSETRON HCL 4 MG PO TABS: 4.0000 mg | ORAL_TABLET | Freq: Four times a day (QID) | ORAL | Status: DC | PRN

## 2016-09-27 MED ORDER — PHENYLEPHRINE 40 MCG/ML (10ML) SYRINGE FOR IV PUSH (FOR BLOOD PRESSURE SUPPORT)
PREFILLED_SYRINGE | INTRAVENOUS | Status: AC
  Filled 2016-09-27: qty 20

## 2016-09-27 MED ORDER — LACTATED RINGERS IV SOLN
INTRAVENOUS | Status: DC | PRN
  Administered 2016-09-27: 07:00:00 via INTRAVENOUS

## 2016-09-27 MED ORDER — HYDROMORPHONE HCL 1 MG/ML IJ SOLN: 0.2500 mg | INTRAMUSCULAR | Status: DC | PRN

## 2016-09-27 MED ORDER — SUGAMMADEX SODIUM 200 MG/2ML IV SOLN
INTRAVENOUS | Status: AC
  Filled 2016-09-27: qty 2

## 2016-09-27 MED ORDER — BUPIVACAINE-EPINEPHRINE (PF) 0.5% -1:200000 IJ SOLN
INTRAMUSCULAR | Status: AC
  Filled 2016-09-27: qty 30

## 2016-09-27 MED ORDER — ACETAMINOPHEN 650 MG RE SUPP: 650.0000 mg | Freq: Four times a day (QID) | RECTAL | Status: DC | PRN

## 2016-09-27 MED ORDER — KETOROLAC TROMETHAMINE 30 MG/ML IJ SOLN
INTRAMUSCULAR | Status: AC
  Filled 2016-09-27: qty 1

## 2016-09-27 MED ORDER — ONDANSETRON HCL 4 MG/2ML IJ SOLN
INTRAMUSCULAR | Status: AC
  Filled 2016-09-27: qty 4

## 2016-09-27 MED ORDER — METOCLOPRAMIDE HCL 5 MG PO TABS: 5.0000 mg | ORAL_TABLET | Freq: Three times a day (TID) | ORAL | Status: DC | PRN

## 2016-09-27 MED ORDER — METOCLOPRAMIDE HCL 5 MG/ML IJ SOLN: 5.0000 mg | Freq: Three times a day (TID) | INTRAMUSCULAR | Status: DC | PRN

## 2016-09-27 MED ORDER — DIPHENHYDRAMINE HCL 12.5 MG/5ML PO ELIX: 12.5000 mg | ORAL_SOLUTION | ORAL | Status: DC | PRN

## 2016-09-27 MED ORDER — PHENOL 1.4 % MT LIQD: 1.0000 | OROMUCOSAL | Status: DC | PRN

## 2016-09-27 MED ORDER — TRANEXAMIC ACID 1000 MG/10ML IV SOLN
1000.0000 mg | Freq: Once | INTRAVENOUS | Status: AC
  Administered 2016-09-27: 1000 mg via INTRAVENOUS
  Filled 2016-09-27: qty 10

## 2016-09-27 SURGICAL SUPPLY — 56 items
ADH SKN CLS APL DERMABOND .7 (GAUZE/BANDAGES/DRESSINGS) ×2
ADH SKN CLS LQ APL DERMABOND (GAUZE/BANDAGES/DRESSINGS) ×1
ALCOHOL ISOPROPYL (RUBBING) (MISCELLANEOUS) ×2 IMPLANT
BLADE CLIPPER SURG (BLADE) IMPLANT
CAPT HIP TOTAL 2 ×1 IMPLANT
CHLORAPREP W/TINT 26ML (MISCELLANEOUS) ×2 IMPLANT
COVER SURGICAL LIGHT HANDLE (MISCELLANEOUS) ×2 IMPLANT
DERMABOND ADHESIVE PROPEN (GAUZE/BANDAGES/DRESSINGS) ×1
DERMABOND ADVANCED (GAUZE/BANDAGES/DRESSINGS) ×2
DERMABOND ADVANCED .7 DNX12 (GAUZE/BANDAGES/DRESSINGS) ×2 IMPLANT
DERMABOND ADVANCED .7 DNX6 (GAUZE/BANDAGES/DRESSINGS) IMPLANT
DRAPE C-ARM 42X72 X-RAY (DRAPES) ×2 IMPLANT
DRAPE STERI IOBAN 125X83 (DRAPES) ×2 IMPLANT
DRAPE U-SHAPE 47X51 STRL (DRAPES) ×6 IMPLANT
DRSG AQUACEL AG ADV 3.5X10 (GAUZE/BANDAGES/DRESSINGS) ×2 IMPLANT
ELECT BLADE 4.0 EZ CLEAN MEGAD (MISCELLANEOUS) ×2
ELECT REM PT RETURN 9FT ADLT (ELECTROSURGICAL) ×2
ELECTRODE BLDE 4.0 EZ CLN MEGD (MISCELLANEOUS) ×1 IMPLANT
ELECTRODE REM PT RTRN 9FT ADLT (ELECTROSURGICAL) ×1 IMPLANT
EVACUATOR 1/8 PVC DRAIN (DRAIN) IMPLANT
GLOVE BIO SURGEON STRL SZ8.5 (GLOVE) ×4 IMPLANT
GLOVE BIOGEL PI IND STRL 8.5 (GLOVE) ×1 IMPLANT
GLOVE BIOGEL PI INDICATOR 8.5 (GLOVE) ×1
GOWN STRL REUS W/ TWL LRG LVL3 (GOWN DISPOSABLE) ×2 IMPLANT
GOWN STRL REUS W/TWL 2XL LVL3 (GOWN DISPOSABLE) ×2 IMPLANT
GOWN STRL REUS W/TWL LRG LVL3 (GOWN DISPOSABLE) ×4
HANDPIECE INTERPULSE COAX TIP (DISPOSABLE) ×2
HOOD PEEL AWAY FACE SHEILD DIS (HOOD) ×4 IMPLANT
KIT BASIN OR (CUSTOM PROCEDURE TRAY) ×2 IMPLANT
KIT ROOM TURNOVER OR (KITS) ×2 IMPLANT
MANIFOLD NEPTUNE II (INSTRUMENTS) ×2 IMPLANT
MARKER SKIN DUAL TIP RULER LAB (MISCELLANEOUS) ×4 IMPLANT
NDL SPNL 18GX3.5 QUINCKE PK (NEEDLE) ×1 IMPLANT
NEEDLE SPNL 18GX3.5 QUINCKE PK (NEEDLE) ×2 IMPLANT
NS IRRIG 1000ML POUR BTL (IV SOLUTION) ×2 IMPLANT
PACK TOTAL JOINT (CUSTOM PROCEDURE TRAY) ×2 IMPLANT
PACK UNIVERSAL I (CUSTOM PROCEDURE TRAY) ×2 IMPLANT
PAD ARMBOARD 7.5X6 YLW CONV (MISCELLANEOUS) ×4 IMPLANT
SAW OSC TIP CART 19.5X105X1.3 (SAW) ×2 IMPLANT
SEALER BIPOLAR AQUA 6.0 (INSTRUMENTS) ×1 IMPLANT
SET HNDPC FAN SPRY TIP SCT (DISPOSABLE) ×1 IMPLANT
SOL PREP POV-IOD 4OZ 10% (MISCELLANEOUS) ×2 IMPLANT
SUT ETHIBOND NAB CT1 #1 30IN (SUTURE) ×4 IMPLANT
SUT MNCRL AB 3-0 PS2 18 (SUTURE) ×2 IMPLANT
SUT MON AB 2-0 CT1 36 (SUTURE) ×2 IMPLANT
SUT VIC AB 1 CT1 27 (SUTURE) ×2
SUT VIC AB 1 CT1 27XBRD ANBCTR (SUTURE) ×1 IMPLANT
SUT VIC AB 2-0 CT1 27 (SUTURE) ×2
SUT VIC AB 2-0 CT1 TAPERPNT 27 (SUTURE) ×1 IMPLANT
SUT VLOC 180 0 24IN GS25 (SUTURE) ×2 IMPLANT
SYR 50ML LL SCALE MARK (SYRINGE) ×2 IMPLANT
TOWEL OR 17X24 6PK STRL BLUE (TOWEL DISPOSABLE) ×2 IMPLANT
TOWEL OR 17X26 10 PK STRL BLUE (TOWEL DISPOSABLE) ×2 IMPLANT
TRAY CATH 16FR W/PLASTIC CATH (SET/KITS/TRAYS/PACK) IMPLANT
TRAY FOLEY CATH SILVER 16FR (SET/KITS/TRAYS/PACK) IMPLANT
WATER STERILE IRR 1000ML POUR (IV SOLUTION) ×6 IMPLANT

## 2016-09-27 NOTE — Anesthesia Postprocedure Evaluation (Signed)
Anesthesia Post Note  Patient: John Carey  Procedure(s) Performed: Procedure(s) (LRB): RIGHT TOTAL HIP ARTHROPLASTY ANTERIOR APPROACH (Right)     Patient location during evaluation: PACU Anesthesia Type: MAC Level of consciousness: oriented and awake and alert Pain management: pain level controlled Vital Signs Assessment: post-procedure vital signs reviewed and stable Respiratory status: spontaneous breathing, respiratory function stable and patient connected to nasal cannula oxygen Cardiovascular status: blood pressure returned to baseline and stable Postop Assessment: no headache and no backache Anesthetic complications: no    Last Vitals:  Vitals:   09/27/16 1100 09/27/16 1115  BP: 118/79   Pulse: (!) 41 (!) 44  Resp: 13 12  Temp:    SpO2: 100% 100%    Last Pain:  Vitals:   09/27/16 1115  TempSrc:   PainSc: 0-No pain                 Britanni Yarde Ransom

## 2016-09-27 NOTE — Interval H&P Note (Signed)
History and Physical Interval Note:  09/27/2016 7:36 AM  John Carey  has presented today for surgery, with the diagnosis of Degenerative joint disease right hip  The various methods of treatment have been discussed with the patient and family. After consideration of risks, benefits and other options for treatment, the patient has consented to  Procedure(s) with comments: RIGHT TOTAL HIP ARTHROPLASTY ANTERIOR APPROACH (Right) - Needs RNFA as a surgical intervention .  The patient's history has been reviewed, patient examined, no change in status, stable for surgery.  I have reviewed the patient's chart and labs.  Questions were answered to the patient's satisfaction.     [name], [name]

## 2016-09-27 NOTE — Evaluation (Signed)
Physical Therapy Evaluation Patient Details Name: John Carey MRN: [id number] DOB: 08-31-1965 Today's Date: 09/27/2016   History of Present Illness  Pt is a 51 y/o male s/p R THA, direct anterior approach. PMH includes bradycardia, migraines, and Raynauds phenomena.   Clinical Impression  Pt is s/p surgery above with deficits below. PTA, pt was independent with functional mobility. Upon eval, pt limited by post op pain and weakness, as well as, slightly decreased balance. REquired min guard for mobility this session. Reports family will be able to assist at home as needed and has all necessary DME. Follow up recommendations per MD arrangements. Will continue to follow acutely to maximize functional mobility independence and safety.     Follow Up Recommendations DC plan and follow up therapy as arranged by surgeon;Supervision for mobility/OOB    Equipment Recommendations  None recommended by PT    Recommendations for Other Services       Precautions / Restrictions Precautions Precautions: None Precaution Comments: Reviewed supine ther ex with pt.  Restrictions Weight Bearing Restrictions: Yes RLE Weight Bearing: Weight bearing as tolerated      Mobility  Bed Mobility Overal bed mobility: Needs Assistance Bed Mobility: Supine to Sit     Supine to sit: Supervision     General bed mobility comments: Supervision for safety.   Transfers Overall transfer level: Needs assistance Equipment used: Rolling walker (2 wheeled) Transfers: Sit to/from Stand Sit to Stand: Min guard         General transfer comment: Min guard for safety. Demonstrated safe hand placement.   Ambulation/Gait Ambulation/Gait assistance: Min guard Ambulation Distance (Feet): 50 Feet Assistive device: Rolling walker (2 wheeled) Gait Pattern/deviations: Step-through pattern;Decreased step length - right;Decreased step length - left;Decreased weight shift to right;Antalgic Gait velocity:  Decreased Gait velocity interpretation: Below normal speed for age/gender General Gait Details: Slow, antalgic gait. Verbal cues for LE sequencing during ambulation, and able to progress to step through gait pattern. Safety cues to keep both hands on RW to increase stability.   Stairs            Wheelchair Mobility    Modified Rankin (Stroke Patients Only)       Balance Overall balance assessment: Needs assistance Sitting-balance support: No upper extremity supported;Feet supported Sitting balance-Leahy Scale: Good     Standing balance support: Bilateral upper extremity supported;During functional activity;No upper extremity supported Standing balance-Leahy Scale: Fair Standing balance comment: Able to maintain static standing without UE support                              Pertinent Vitals/Pain Pain Assessment: 0-10 Pain Score: 3  Pain Location: R hip  Pain Descriptors / Indicators: Aching;Operative site guarding Pain Intervention(s): Limited activity within patient's tolerance;Monitored during session;Repositioned    Home Living Family/patient expects to be discharged to:: Private residence Living Arrangements: Alone Available Help at Discharge: Family;Available 24 hours/day Type of Home: House Home Access: Stairs to enter Entrance Stairs-Rails: Left Entrance Stairs-Number of Steps: 5 Home Layout: Two level Home Equipment: Crutches;Cane - single point;Walker - 2 wheels;Shower seat      Prior Function Level of Independence: Independent               Hand Dominance   Dominant Hand: Right    Extremity/Trunk Assessment   Upper Extremity Assessment Upper Extremity Assessment: Overall WFL for tasks assessed    Lower Extremity Assessment Lower Extremity Assessment: RLE deficits/detail RLE  Deficits / Details: Numbness at incision site. Deficits consistent with post op pain and weakness. Able to perform exercise below.     Cervical / Trunk  Assessment Cervical / Trunk Assessment: Normal  Communication   Communication: No difficulties  Cognition Arousal/Alertness: Awake/alert Behavior During Therapy: WFL for tasks assessed/performed Overall Cognitive Status: Within Functional Limits for tasks assessed                                        General Comments      Exercises Total Joint Exercises Ankle Circles/Pumps: AROM;Both;20 reps;Supine Quad Sets: AROM;Right;10 reps;Supine Towel Squeeze: AROM;Both;10 reps;Supine Short Arc Quad: AROM;Right;10 reps;Supine Heel Slides: AROM;Right;10 reps;Supine Hip ABduction/ADduction: AROM;Right;10 reps;Supine   Assessment/Plan    PT Assessment Patient needs continued PT services  PT Problem List Decreased strength;Decreased range of motion;Decreased balance;Decreased mobility;Decreased knowledge of use of DME;Pain       PT Treatment Interventions DME instruction;Gait training;Stair training;Functional mobility training;Therapeutic activities;Therapeutic exercise;Balance training;Neuromuscular re-education;Patient/family education    PT Goals (Current goals can be found in the Care Plan section)  Acute Rehab PT Goals Patient Stated Goal: to go home early tomorrow  PT Goal Formulation: With patient Time For Goal Achievement: 10/04/16 Potential to Achieve Goals: Good    Frequency 7X/week   Barriers to discharge        Co-evaluation               AM-PAC PT "6 Clicks" Daily Activity  Outcome Measure Difficulty turning over in bed (including adjusting bedclothes, sheets and blankets)?: None Difficulty moving from lying on back to sitting on the side of the bed? : None Difficulty sitting down on and standing up from a chair with arms (e.g., wheelchair, bedside commode, etc,.)?: Unable Help needed moving to and from a bed to chair (including a wheelchair)?: A Little Help needed walking in hospital room?: A Little Help needed climbing 3-5 steps with a  railing? : A Little 6 Click Score: 18    End of Session Equipment Utilized During Treatment: Gait belt Activity Tolerance: Patient tolerated treatment well Patient left: in chair;with call bell/phone within reach;with family/visitor present Nurse Communication: Mobility status PT Visit Diagnosis: Other abnormalities of gait and mobility (R26.89);Pain Pain - Right/Left: Right Pain - part of body: Hip    Time: 1540-0867 PT Time Calculation (min) (ACUTE ONLY): 28 min   Charges:   PT Evaluation $PT Eval Low Complexity: 1 Low PT Treatments $Gait Training: 8-22 mins   PT G Codes:        Leighton Ruff, PT, DPT  Acute Rehabilitation Services  Pager: [phone]   Rudean Hitt 09/27/2016, 5:31 PM

## 2016-09-27 NOTE — H&P (View-Only) (Signed)
John Carey, 51 y.o. male, John Carey, John Carey, John Carey, John Carey assistive devices, weight reduction as appropriate and activity modification.  Onset Carey symptoms was gradual starting 4 years ago with gradually worsening course since that time.The patient noted no past surgery on the right hip(s).  Patient currently rates pain in the right hip at 10 out Carey 10 with activity. Patient John night pain, worsening Carey pain with activity and weight bearing, pain that interfers with activities Carey daily living, pain with passive range Carey motion and crepitus. Patient John evidence Carey subchondral cysts, subchondral sclerosis, periarticular osteophytes and joint space narrowing by imaging studies. This condition presents safety issues increasing the risk Carey falls.  There is no current active infection.  Patient Active Problem List   Diagnosis Date Noted  . Sinus bradycardia 08/31/2016  . Preoperative cardiovascular examination 08/31/2016  . Numerous moles 04/16/2016  . Migraine 04/16/2016  . Raynauds phenomenon 07/27/2011   Past Medical History:  Diagnosis Date  . Migraine     Past Surgical History:  Procedure Laterality Date  . [place]   right-open removal     (Not in a hospital admission) No Known Allergies  Social History  Substance John Topics  . Smoking status: Never Smoker  . Smokeless tobacco: Never Used  . Alcohol John Yes     Comment: social    Family History  Problem Relation Age Carey Onset  . Parkinsonism Mother   . CAD Mother        22's  . Hypertension Father   . Prostate cancer Father 50   . CAD Paternal Grandfather        Massive MI at age 74 - heavy alcohol and tobacco John  . Diabetes Neg Hx      Review Carey Systems  Constitutional: Negative.   HENT: Negative.   Eyes: Negative.   Respiratory: Negative.   Cardiovascular: Negative.   Gastrointestinal: Negative.   Genitourinary: Negative.   Musculoskeletal: Negative.   Skin: Negative.   Neurological: Negative.   Endo/Heme/Allergies: Negative.   Psychiatric/Behavioral: Negative.     Objective:  Physical Exam  Vitals reviewed. Constitutional: He is oriented to person, place, and time. He appears well-developed and well-nourished.  HENT:  Head: Normocephalic and atraumatic.  Eyes: Pupils are equal, round, and reactive to light. Conjunctivae and EOM are normal.  Neck: Normal range Carey motion. Neck supple.  Cardiovascular: Normal rate, regular rhythm and intact distal pulses.   Respiratory: Effort normal. No respiratory distress.  GI: Soft. He exhibits no distension.  Genitourinary:  Genitourinary Comments: deferred  Musculoskeletal:       Right hip: He exhibits decreased range Carey motion, decreased strength and bony tenderness.  Neurological: He is alert and oriented to person, place, and time. He John normal reflexes.  Skin: Skin is warm and dry.  Psychiatric: He John a normal mood and affect. His behavior is normal. Judgment and thought content normal.    Vital signs in last 24 hours: @VSRANGES @  Labs:   Estimated body mass index is 24.88 kg/m as calculated from the following:   Height as Carey 08/31/16: 5\' 8"  (1.727 m).  Weight as Carey 08/31/16: 74.2 kg (163 lb 9.6 oz).   Imaging Review Plain radiographs demonstrate severe degenerative joint disease Carey the right hip(s). The bone quality appears to be adequate for age and reported activity level.  Assessment/Plan:  End stage arthritis, right hip(s)  The patient history, physical examination, clinical judgement Carey the provider and imaging studies are  consistent with end stage degenerative joint disease Carey the right hip(s) and John hip arthroplasty is deemed medically necessary. The treatment options including medical management, injection therapy, arthroscopy and arthroplasty were discussed at length. The risks and benefits Carey John hip arthroplasty were presented and reviewed. The risks due to aseptic loosening, infection, stiffness, dislocation/subluxation,  thromboembolic complications and other imponderables were discussed.  The patient acknowledged the explanation, agreed to proceed with the plan and consent was signed. Patient is being admitted for inpatient treatment for surgery, pain control, PT, OT, prophylactic antibiotics, VTE prophylaxis, progressive ambulation and ADL's and discharge planning.The patient is planning to be discharged home with HEP

## 2016-09-27 NOTE — Anesthesia Preprocedure Evaluation (Signed)
Anesthesia Evaluation  Patient identified by MRN, date of birth, ID band Patient awake    Reviewed: Allergy & Precautions, NPO status , Patient's Chart, lab work & pertinent test results  Airway Mallampati: I  TM Distance: >3 FB Neck ROM: Full    Dental   Pulmonary    Pulmonary exam normal        Cardiovascular Normal cardiovascular exam     Neuro/Psych    GI/Hepatic   Endo/Other    Renal/GU      Musculoskeletal   Abdominal   Peds  Hematology   Anesthesia Other Findings   Reproductive/Obstetrics                             Anesthesia Physical Anesthesia Plan  ASA: II  Anesthesia Plan: Spinal   Post-op Pain Management:    Induction: Intravenous  PONV Risk Score and Plan: 2 and Ondansetron and Dexamethasone  Airway Management Planned: Simple Face Mask  Additional Equipment:   Intra-op Plan:   Post-operative Plan:   Informed Consent: I have reviewed the patients History and Physical, chart, labs and discussed the procedure including the risks, benefits and alternatives for the proposed anesthesia with the patient or authorized representative who has indicated his/her understanding and acceptance.     Plan Discussed with: CRNA and Surgeon  Anesthesia Plan Comments:         Anesthesia Quick Evaluation

## 2016-09-27 NOTE — Transfer of Care (Signed)
Immediate Anesthesia Transfer of Care Note  Patient: John Carey  Procedure(s) Performed: Procedure(s) with comments: RIGHT TOTAL HIP ARTHROPLASTY ANTERIOR APPROACH (Right) - Needs RNFA  Patient Location: PACU  Anesthesia Type:MAC and Spinal  Level of Consciousness: awake, alert , oriented and patient cooperative  Airway & Oxygen Therapy: Patient Spontanous Breathing and Patient connected to face mask oxygen  Post-op Assessment: Report given to RN and Post -op Vital signs reviewed and stable  Post vital signs: Reviewed and stable  Last Vitals:  Vitals:   09/27/16 0619 09/27/16 1002  BP: 137/84 98/63  Pulse: (!) 46 (!) 50  Resp: 20 13  Temp: 36.7 C 36.4 C  SpO2: 99% 99%    Last Pain:  Vitals:   09/27/16 1002  TempSrc:   PainSc: (P) 0-No pain         Complications: No apparent anesthesia complications

## 2016-09-27 NOTE — Discharge Instructions (Signed)
°Dr. Sergi Gellner °Joint Replacement Specialist °[facility] °[address] °[place], [place] [address] °[phone] ° ° °TOTAL HIP REPLACEMENT POSTOPERATIVE DIRECTIONS ° ° ° °Hip Rehabilitation, Guidelines Following Surgery  ° °WEIGHT BEARING °Weight bearing as tolerated with assist device (walker, cane, etc) as directed, use it as long as suggested by your surgeon or therapist, typically at least 4-6 weeks. ° °The results of a hip operation are greatly improved after range of motion and muscle strengthening exercises. Follow all safety measures which are given to protect your hip. If any of these exercises cause increased pain or swelling in your joint, decrease the amount until you are comfortable again. Then slowly increase the exercises. Call your caregiver if you have problems or questions.  ° °HOME CARE INSTRUCTIONS  °Most of the following instructions are designed to prevent the dislocation of your new hip.  °Remove items at home which could result in a fall. This includes throw rugs or furniture in walking pathways.  °Continue medications as instructed at time of discharge. °· You may have some home medications which will be placed on hold until you complete the course of blood thinner medication. °· You may start showering once you are discharged home. Do not remove your dressing. °Do not put on socks or shoes without following the instructions of your caregivers.   °Sit on chairs with arms. Use the chair arms to help push yourself up when arising.  °Arrange for the use of a toilet seat elevator so you are not sitting low.  °· Walk with walker as instructed.  °You may resume a sexual relationship in one month or when given the OK by your caregiver.  °Use walker as long as suggested by your caregivers.  °You may put full weight on your legs and walk as much as is comfortable. °Avoid periods of inactivity such as sitting longer than an hour when not asleep. This helps prevent  blood clots.  °You may return to work once you are cleared by your surgeon.  °Do not drive a car for 6 weeks or until released by your surgeon.  °Do not drive while taking narcotics.  °Wear elastic stockings for two weeks following surgery during the day but you may remove then at night.  °Make sure you keep all of your appointments after your operation with all of your doctors and caregivers. You should call the office at the above phone number and make an appointment for approximately two weeks after the date of your surgery. °Please pick up a stool softener and laxative for home use as long as you are requiring pain medications. °· ICE to the affected hip every three hours for 30 minutes at a time and then as needed for pain and swelling. Continue to use ice on the hip for pain and swelling from surgery. You may notice swelling that will progress down to the foot and ankle.  This is normal after surgery.  Elevate the leg when you are not up walking on it.   °It is important for you to complete the blood thinner medication as prescribed by your doctor. °· Continue to use the breathing machine which will help keep your temperature down.  It is common for your temperature to cycle up and down following surgery, especially at night when you are not up moving around and exerting yourself.  The breathing machine keeps your lungs expanded and your temperature down. ° °RANGE OF MOTION AND STRENGTHENING EXERCISES  °These exercises are   designed to help you keep full movement of your hip joint. Follow your caregiver's or physical therapist's instructions. Perform all exercises about fifteen times, three times per day or as directed. Exercise both hips, even if you have had only one joint replacement. These exercises can be done on a training (exercise) mat, on the floor, on a table or on a bed. Use whatever works the best and is most comfortable for you. Use music or television while you are exercising so that the exercises  are a pleasant break in your day. This will make your life better with the exercises acting as a break in routine you can look forward to.  °Lying on your back, slowly slide your foot toward your buttocks, raising your knee up off the floor. Then slowly slide your foot back down until your leg is straight again.  °Lying on your back spread your legs as far apart as you can without causing discomfort.  °Lying on your side, raise your upper leg and foot straight up from the floor as far as is comfortable. Slowly lower the leg and repeat.  °Lying on your back, tighten up the muscle in the front of your thigh (quadriceps muscles). You can do this by keeping your leg straight and trying to raise your heel off the floor. This helps strengthen the largest muscle supporting your knee.  °Lying on your back, tighten up the muscles of your buttocks both with the legs straight and with the knee bent at a comfortable angle while keeping your heel on the floor.  ° °SKILLED REHAB INSTRUCTIONS: °If the patient is transferred to a skilled rehab facility following release from the hospital, a list of the current medications will be sent to the facility for the patient to continue.  When discharged from the skilled rehab facility, please have the facility set up the patient's Home Health Physical Therapy prior to being released. Also, the skilled facility will be responsible for providing the patient with their medications at time of release from the facility to include their pain medication and their blood thinner medication. If the patient is still at the rehab facility at time of the two week follow up appointment, the skilled rehab facility will also need to assist the patient in arranging follow up appointment in our office and any transportation needs. ° °MAKE SURE YOU:  °Understand these instructions.  °Will watch your condition.  °Will get help right away if you are not doing well or get worse. ° °Pick up stool softner and  laxative for home use following surgery while on pain medications. °Do not remove your dressing. °The dressing is waterproof--it is OK to take showers. °Continue to use ice for pain and swelling after surgery. °Do not use any lotions or creams on the incision until instructed by your surgeon. °Total Hip Protocol. ° ° °

## 2016-09-27 NOTE — Anesthesia Procedure Notes (Signed)
Spinal  Patient location during procedure: OR Start time: 09/27/2016 7:37 AM End time: 09/27/2016 7:40 AM Staffing Anesthesiologist: Lillia Abed Performed: anesthesiologist  Preanesthetic Checklist Completed: patient identified, surgical consent, pre-op evaluation, timeout performed, IV checked, risks and benefits discussed and monitors and equipment checked Spinal Block Patient position: sitting Prep: DuraPrep Patient monitoring: blood pressure, continuous pulse ox, cardiac monitor and heart rate Approach: right paramedian Location: L3-4 Injection technique: single-shot Needle Needle type: Pencan  Needle gauge: 24 G Needle length: 9 cm Needle insertion depth: 6 cm

## 2016-09-27 NOTE — Op Note (Signed)
OPERATIVE REPORT  SURGEON: Rod Can, MD   ASSISTANT: Ky Barban, RNFA.  PREOPERATIVE DIAGNOSIS: Right hip arthritis.   POSTOPERATIVE DIAGNOSIS: Right hip arthritis.   PROCEDURE: Right total hip arthroplasty, anterior approach.   IMPLANTS: DePuy Tri Lock stem, size 5, hi offset. DePuy Pinnacle Cup, size 58 mm. DePuy Altrx liner, size 36 by 58 mm, neutral. DePuy Biolox ceramic head ball, size 36 + 1.5 mm.  ANESTHESIA:  Spinal  ESTIMATED BLOOD LOSS: 250 mL.  ANTIBIOTICS: 2 g Ancef.  DRAINS: None.  COMPLICATIONS: None.   CONDITION: PACU - hemodynamically stable.   BRIEF CLINICAL NOTE: John Carey is a 51 y.o. male with a long-standing history of Right hip arthritis. After failing conservative management, the patient was indicated for total hip arthroplasty. The risks, benefits, and alternatives to the procedure were explained, and the patient elected to proceed.  PROCEDURE IN DETAIL: Surgical site was marked by myself in the pre-op holding area. Once inside the operating room, spinal anesthesia was obtained, and a foley catheter was inserted. The patient was then positioned on the Hana table. All bony prominences were well padded. The hip was prepped and draped in the normal sterile surgical fashion. A time-out was called verifying side and site of surgery. The patient received IV antibiotics within 60 minutes of beginning the procedure.  The direct anterior approach to the hip was performed through the Hueter interval. Lateral femoral circumflex vessels were treated with the Auqumantys. The anterior capsule was exposed and an inverted T capsulotomy was made.The femoral neck cut was made to the level of the templated cut. A corkscrew was placed into the head and the head was removed. The femoral head was found to have eburnated bone. The head was passed to the back table and was measured.  Acetabular exposure was achieved, and the pulvinar and labrum were  excised. Sequential reaming of the acetabulum was then performed up to a size 57 mm reamer. A 58 mm cup was then opened and impacted into place at approximately 40 degrees of abduction and 20 degrees of anteversion. The final polyethylene liner was impacted into place and acetabular osteophytes were removed.   I then gained femoral exposure taking care to protect the abductors and greater trochanter. This was performed using standard external rotation, extension, and adduction. The capsule was peeled off the inner aspect of the greater trochanter, taking care to preserve the short external rotators. A cookie cutter was used to enter the femoral canal, and then the femoral canal finder was placed. Sequential broaching was performed up to a size 5. Calcar planer was used on the femoral neck remnant. I placed a hi offset neck and a trial head ball. The hip was reduced. Leg lengths and offset were checked fluoroscopically. The hip was dislocated and trial components were removed. The final implants were placed, and the hip was reduced.  Fluoroscopy was used to confirm component position and leg lengths. At 90 degrees of external rotation and full extension, the hip was stable to an anterior directed force.  The wound was copiously irrigated with normal saline using pulse lavage. Marcaine solution was injected into the periarticular soft tissue. The wound was closed in layers using #1 Vicryl and V-Loc for the fascia, 2-0 Vicryl for the subcutaneous fat, 2-0 Monocryl for the deep dermal layer, 3-0 running Monocryl subcuticular stitch, and Dermabond for the skin. Once the glue was fully dried, an Aquacell Ag dressing was applied. The patient was transported to the recovery room in  stable condition. Sponge, needle, and instrument counts were correct at the end of the case x2. The patient tolerated the procedure well and there were no known complications.

## 2016-09-28 ENCOUNTER — Encounter (HOSPITAL_COMMUNITY): Payer: Self-pay | Admitting: Orthopedic Surgery

## 2016-09-28 LAB — BASIC METABOLIC PANEL
Anion gap: 6 (ref 5–15)
BUN: 8 mg/dL (ref 6–20)
CHLORIDE: 102 mmol/L (ref 101–111)
CO2: 26 mmol/L (ref 22–32)
Calcium: 8.1 mg/dL — ABNORMAL LOW (ref 8.9–10.3)
Creatinine, Ser: 0.89 mg/dL (ref 0.61–1.24)
GFR calc Af Amer: >60 mL/min (ref >60)
GFR calc non Af Amer: >60 mL/min (ref >60)
GLUCOSE: 124 mg/dL — AB (ref 65–99)
POTASSIUM: 3.9 mmol/L (ref 3.5–5.1)
Sodium: 134 mmol/L — ABNORMAL LOW (ref 135–145)

## 2016-09-28 LAB — CBC
HCT: 30.8 % — ABNORMAL LOW (ref 39.0–52.0)
HEMOGLOBIN: 10.5 g/dL — AB (ref 13.0–17.0)
MCH: 30.8 pg (ref 26.0–34.0)
MCHC: 34.1 g/dL (ref 30.0–36.0)
MCV: 90.3 fL (ref 78.0–100.0)
PLATELETS: 211 10*3/uL (ref 150–400)
RBC: 3.41 MIL/uL — AB (ref 4.22–5.81)
RDW: 12.4 % (ref 11.5–15.5)
WBC: 9.7 10*3/uL (ref 4.0–10.5)

## 2016-09-28 MED ORDER — ASPIRIN 81 MG PO CHEW: 81.0000 mg | CHEWABLE_TABLET | Freq: Two times a day (BID) | ORAL | 1 refills | Status: DC

## 2016-09-28 MED ORDER — HYDROCODONE-ACETAMINOPHEN 5-325 MG PO TABS: 1.0000 | ORAL_TABLET | ORAL | 0 refills | Status: DC | PRN

## 2016-09-28 MED ORDER — ONDANSETRON HCL 4 MG PO TABS: 4.0000 mg | ORAL_TABLET | Freq: Four times a day (QID) | ORAL | 0 refills | Status: DC | PRN

## 2016-09-28 MED ORDER — DOCUSATE SODIUM 100 MG PO CAPS: 100.0000 mg | ORAL_CAPSULE | Freq: Two times a day (BID) | ORAL | 1 refills | Status: DC

## 2016-09-28 MED ORDER — SENNA 8.6 MG PO TABS: 2.0000 | ORAL_TABLET | Freq: Every day | ORAL | 0 refills | Status: DC

## 2016-09-28 NOTE — Progress Notes (Signed)
Physical therapy Treatment note: Pt with excellent gait without use of AD. Pt educated for cane as he states he may use at home for safety initially. Pt able to complete stairs and all standing HEP without need for instruction.    09/28/16 1300  PT Visit Information  Last PT Received On 09/28/16  Assistance Needed +1  History of Present Illness Pt is a 51 y/o male s/p Rt THA, direct anterior approach. PMH includes bradycardia, migraines, and Raynauds phenomena.   Precautions  Precautions None  Restrictions  RLE Weight Bearing WBAT  Pain Assessment  Pain Assessment No/denies pain  Cognition  Arousal/Alertness Awake/alert  Behavior During Therapy WFL for tasks assessed/performed  Overall Cognitive Status Within Functional Limits for tasks assessed  Bed Mobility  General bed mobility comments in chair on arrival  Transfers  Overall transfer level Independent  Ambulation/Gait  Ambulation/Gait assistance Modified independent (Device/Increase time)  Ambulation Distance (Feet) 500 Feet  Assistive device None  Gait Pattern/deviations Step-through pattern;Decreased stride length;Decreased stance time - left  Gait velocity interpretation at or above normal speed for age/gender  Stairs Yes  Stairs assistance Modified independent (Device/Increase time)  Stair Management One rail Left;Step to pattern;Forwards  Number of Stairs 11  General stair comments pt performed stairs without cues and was able to perform with reciprocal pattern on 3 ascending  Balance  Overall balance assessment No apparent balance deficits (not formally assessed)  Exercises  Exercises Total Joint  Total Joint Exercises  Hip ABduction/ADduction AROM;Right;15 reps;Standing  Knee Flexion AROM;Right;Standing;15 reps  Marching in Standing AROM;Right;Standing;15 reps  Standing Hip Extension AROM;Right;Standing;15 reps  PT - End of Session  Activity Tolerance Patient tolerated treatment well  Patient left in chair;with  call bell/phone within reach  Nurse Communication Mobility status  PT - Assessment/Plan  PT Plan Current plan remains appropriate  PT Visit Diagnosis Other abnormalities of gait and mobility (R26.89)  Follow Up Recommendations DC plan and follow up therapy as arranged by surgeon  PT equipment None recommended by PT  AM-PAC PT "6 Clicks" Daily Activity Outcome Measure  Difficulty turning over in bed (including adjusting bedclothes, sheets and blankets)? 4  Difficulty moving from lying on back to sitting on the side of the bed?  4  Difficulty sitting down on and standing up from a chair with arms (e.g., wheelchair, bedside commode, etc,.)? 4  Help needed moving to and from a bed to chair (including a wheelchair)? 4  Help needed walking in hospital room? 4  Help needed climbing 3-5 steps with a railing?  4  6 Click Score 24  Mobility G Code  CH  PT Goal Progression  Progress towards PT goals Progressing toward goals  PT Time Calculation  PT Start Time (ACUTE ONLY) 1236  PT Stop Time (ACUTE ONLY) 1247  PT Time Calculation (min) (ACUTE ONLY) 11 min  PT General Charges  $$ ACUTE PT VISIT 1 Visit  PT Treatments  $Gait Training 8-22 mins  Elwyn Reach, [place]

## 2016-09-28 NOTE — Progress Notes (Signed)
   Subjective:  Patient reports pain as mild to moderate.  Ambulated with PT using no AD. Denies N/V/CP/SOB.  Objective:   VITALS:   Vitals:   09/27/16 2016 09/28/16 0031 09/28/16 0410 09/28/16 0813  BP: 140/89 132/79 105/67 131/72  Pulse: 86 68 85 83  Resp: 16 16 17 18   Temp:  98.5 F (36.9 C) 98.5 F (36.9 C) 98.2 F (36.8 C)  TempSrc:  Oral Oral Oral  SpO2: 100% 100% 100% 100%  Weight:       NAD ABD soft Sensation intact distally Intact pulses distally Dorsiflexion/Plantar flexion intact Incision: dressing C/D/I Compartment soft   Lab Results  Component Value Date   WBC 9.7 09/28/2016   HGB 10.5 (L) 09/28/2016   HCT 30.8 (L) 09/28/2016   MCV 90.3 09/28/2016   PLT 211 09/28/2016   BMET    Component Value Date/Time   NA 134 (L) 09/28/2016 0631   K 3.9 09/28/2016 0631   CL 102 09/28/2016 0631   CO2 26 09/28/2016 0631   GLUCOSE 124 (H) 09/28/2016 0631   GLUCOSE 85 12/10/2005 0955   BUN 8 09/28/2016 0631   CREATININE 0.89 09/28/2016 0631   CALCIUM 8.1 (L) 09/28/2016 0631   GFRNONAA >60 09/28/2016 0631   GFRAA >60 09/28/2016 0631     Assessment/Plan: 1 Day Post-Op   Principal Problem:   Osteoarthritis of right hip   WBAT with walker ASA, SCDs, TEDs PO pain control PT D/C home today   Kadar Chance, [name] 09/28/2016, 1:38 PM   Rod Can, MD Cell [phone]

## 2016-09-28 NOTE — Plan of Care (Signed)
Problem: Education: Goal: Knowledge of [facility] Education information/materials will improve Outcome: Progressing POC reviewed with pt.   

## 2016-09-28 NOTE — Care Management Note (Signed)
Case Management Note  Patient Details  Name: John Carey MRN: [id number] Date of Birth: 04-14-1965  Subjective/Objective:  51 yr old gentleman s/p right total hip arthroplasty, anterior approach.                   Action/Plan: Case manager spoke with patient concerning discharge plan and DME needs. Patient will not have [place] per MD instructions. Has RW and Cane. Will have family support at discharge. No case manager needs identified.    Expected Discharge Date:   09/28/16               Expected Discharge Plan:  Home/Self Care  In-House Referral:  NA  Discharge planning Services  CM Consult  Post Acute Care Choice:  NA Choice offered to:  Patient  DME Arranged:  N/A DME Agency:  [employer]  HH Arranged:  NA HH Agency:  NA  Status of Service:  Completed, signed off  If discussed at [place] of Stay Meetings, dates discussed:    Additional Comments:  Ninfa Meeker, RN 09/28/2016, 1:09 PM

## 2016-09-28 NOTE — Progress Notes (Signed)
Pt voice concern about area of redness with minimal itchiness surrounding incisional site. Dressing in place. Patient verbalized he will address this with Dr. Lyla Carey during round. No distress noted.   Ave Filter, RN

## 2016-09-28 NOTE — Discharge Summary (Signed)
Physician Discharge Summary  Patient ID: John Carey MRN: [id number] DOB/AGE: 06/06/1965 51 y.o.  Admit date: 09/27/2016 Discharge date: 09/28/2016  Admission Diagnoses:  Osteoarthritis of right hip  Discharge Diagnoses:  Principal Problem:   Osteoarthritis of right hip   Past Medical History:  Diagnosis Date  . History of kidney stones   . Migraine     Surgeries: Procedure(s): RIGHT TOTAL HIP ARTHROPLASTY ANTERIOR APPROACH on 09/27/2016   Consultants (if any):   Discharged Condition: Improved  Hospital Course: LLOYDE LUDLAM is an 51 y.o. male who was admitted 09/27/2016 with a diagnosis of Osteoarthritis of right hip and went to the operating room on 09/27/2016 and underwent the above named procedures.    He was given perioperative antibiotics:  Anti-infectives    Start     Dose/Rate Route Frequency Ordered Stop   09/27/16 1400  ceFAZolin (ANCEF) IVPB 2g/100 mL premix     2 g 200 mL/hr over 30 Minutes Intravenous Every 6 hours 09/27/16 1334 09/27/16 2032   09/27/16 0700  ceFAZolin (ANCEF) IVPB 2g/100 mL premix     2 g 200 mL/hr over 30 Minutes Intravenous To [facility]Stay [facility] 09/24/16 1010 09/27/16 0753    .  He was given sequential compression devices, early ambulation, and ASA for DVT prophylaxis.  He benefited maximally from the hospital stay and there were no complications.    Recent vital signs:  Vitals:   09/28/16 0410 09/28/16 0813  BP: 105/67 131/72  Pulse: 85 83  Resp: 17 18  Temp: 98.5 F (36.9 C) 98.2 F (36.8 C)  SpO2: 100% 100%    Recent laboratory studies:  Lab Results  Component Value Date   HGB 10.5 (L) 09/28/2016   HGB 14.7 09/20/2016   HGB 15.6 06/11/2010   Lab Results  Component Value Date   WBC 9.7 09/28/2016   PLT 211 09/28/2016   No results found for: INR Lab Results  Component Value Date   NA 134 (L) 09/28/2016   K 3.9 09/28/2016   CL 102 09/28/2016   CO2 26 09/28/2016   BUN 8 09/28/2016   CREATININE 0.89  09/28/2016   GLUCOSE 124 (H) 09/28/2016    Discharge Medications:   Allergies as of 09/28/2016   No Known Allergies     Medication List    TAKE these medications   aspirin 81 MG chewable tablet Chew 1 tablet (81 mg total) by mouth 2 (two) times daily.   docusate sodium 100 MG capsule Commonly known as:  COLACE Take 1 capsule (100 mg total) by mouth 2 (two) times daily.   FISH OIL PO Take 1 capsule by mouth daily.   HYDROcodone-acetaminophen 5-325 MG tablet Commonly known as:  NORCO/VICODIN Take 1-2 tablets by mouth every 4 (four) hours as needed (breakthrough pain).   ibuprofen 200 MG tablet Commonly known as:  ADVIL,MOTRIN Take 400 mg by mouth every 8 (eight) hours as needed for headache or mild pain.   Milnacipran 50 MG Tabs tablet Commonly known as:  SAVELLA Take 50 mg by mouth daily.   MOTRIN PM PO Take 2 tablets by mouth at bedtime as needed (sleep).   ondansetron 4 MG tablet Commonly known as:  ZOFRAN Take 1 tablet (4 mg total) by mouth every 6 (six) hours as needed for nausea.   senna 8.6 MG Tabs tablet Commonly known as:  SENOKOT Take 2 tablets (17.2 mg total) by mouth at bedtime.            Durable  Medical Equipment        Start     Ordered   09/27/16 1335  DME Walker rolling  Once    Question:  Patient needs a walker to treat with the following condition  Answer:  Status post total hip replacement, right   09/27/16 1334   09/27/16 1335  DME 3 n 1  Once     09/27/16 1334       Discharge Care Instructions        Start     Ordered   09/28/16 0000  aspirin 81 MG chewable tablet  2 times daily     09/28/16 1341   09/28/16 0000  docusate sodium (COLACE) 100 MG capsule  2 times daily     09/28/16 1341   09/28/16 0000  HYDROcodone-acetaminophen (NORCO/VICODIN) 5-325 MG tablet  Every 4 hours PRN     09/28/16 1341   09/28/16 0000  ondansetron (ZOFRAN) 4 MG tablet  Every 6 hours PRN     09/28/16 1341   09/28/16 0000  senna (SENOKOT) 8.6 MG  TABS tablet  Daily at bedtime     09/28/16 1341   09/28/16 0000  Call MD / Call 911    Comments:  If you experience chest pain or shortness of breath, CALL 911 and be transported to the hospital emergency room.  If you develope a fever above 101 F, pus (white drainage) or increased drainage or redness at the wound, or calf pain, call your surgeon's office.   09/28/16 1341   09/28/16 0000  Diet - low sodium heart healthy     09/28/16 1341   09/28/16 0000  Constipation Prevention    Comments:  Drink plenty of fluids.  Prune juice may be helpful.  You may use a stool softener, such as Colace (over the counter) 100 mg twice a day.  Use MiraLax (over the counter) for constipation as needed.   09/28/16 1341   09/28/16 0000  Increase activity slowly as tolerated     09/28/16 1341   09/28/16 0000  TED hose    Comments:  Use stockings (TED hose) for 2 weeks on both leg(s).  You may remove them at night for sleeping.   09/28/16 1341      Diagnostic Studies: Dg Pelvis Portable  Result Date: 09/27/2016 CLINICAL DATA:  Status post right hip joint replacement. EXAM: PORTABLE PELVIS 1-2 VIEWS COMPARISON:  February 20, 2016 right hip series. 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 The interface with the native bone appears normal. No acute native bone abnormality is observed. IMPRESSION: No immediate postprocedure complication following right hip joint prosthesis placement. Electronically Signed   By: Kamron  Martinique M.D.   On: 09/27/2016 10:57   Dg C-arm 1-60 Min  Result Date: 09/27/2016 CLINICAL DATA:  Right hip placement EXAM: DG C-ARM 61-120 MIN; OPERATIVE RIGHT HIP WITH PELVIS COMPARISON:  None. FINDINGS: Changes of right hip replacement noted. Normal AP alignment. No hardware bony complicating feature noted. IMPRESSION: Right hip replacement.  No complicating feature. Electronically Signed   By: Rolm Baptise M.D.   On: 09/27/2016 09:46   Dg Hip Operative Unilat  With Pelvis Right  Result Date: 09/27/2016 CLINICAL DATA:  Right hip placement EXAM: DG C-ARM 61-120 MIN; OPERATIVE RIGHT HIP WITH PELVIS COMPARISON:  None. FINDINGS: Changes of right hip replacement noted. Normal AP alignment. No hardware bony complicating feature noted. IMPRESSION: Right hip replacement.  No complicating feature. Electronically Signed  By: Rolm Baptise M.D.   On: 09/27/2016 09:46    Disposition: Final discharge disposition not confirmed  Discharge Instructions    Call MD / Call 911    Complete by:  As directed    If you experience chest pain or shortness of breath, CALL 911 and be transported to the hospital emergency room.  If you develope a fever above 101 F, pus (white drainage) or increased drainage or redness at the wound, or calf pain, call your surgeon's office.   Constipation Prevention    Complete by:  As directed    Drink plenty of fluids.  Prune juice may be helpful.  You may use a stool softener, such as Colace (over the counter) 100 mg twice a day.  Use MiraLax (over the counter) for constipation as needed.   Diet - low sodium heart healthy    Complete by:  As directed    Increase activity slowly as tolerated    Complete by:  As directed    TED hose    Complete by:  As directed    Use stockings (TED hose) for 2 weeks on both leg(s).  You may remove them at night for sleeping.      Follow-up Information    Salvatrice Morandi, Aaron Edelman, MD. Schedule an appointment as soon as possible for a visit in 2 weeks.   Specialty:  Orthopedic Surgery Why:  For wound re-check Contact information: [address] [address] [phone]            Signed: Elie Goody 09/28/2016, 1:41 PM

## 2016-09-28 NOTE — Progress Notes (Signed)
Physical Therapy Treatment Patient Details Name: John Carey MRN: [id number] DOB: 10-07-1965 Today's Date: 09/28/2016    History of Present Illness Pt is a 51 y/o male s/p Rt THA, direct anterior approach. PMH includes bradycardia, migraines, and Raynauds phenomena.     PT Comments    Pt moving well, ambulating entire unit and performing stairs. Pt educated for stairs, gait and HEp with verbalized understanding. Pt safe for D/C home with continued acute therapy.    Follow Up Recommendations  DC plan and follow up therapy as arranged by surgeon     Equipment Recommendations  None recommended by PT    Recommendations for Other Services       Precautions / Restrictions Precautions Precautions: None Restrictions RLE Weight Bearing: Weight bearing as tolerated    Mobility  Bed Mobility               General bed mobility comments: in chair on arrival  Transfers Overall transfer level: Modified independent                  Ambulation/Gait Ambulation/Gait assistance: Supervision Ambulation Distance (Feet): 500 Feet Assistive device: Rolling walker (2 wheeled);None Gait Pattern/deviations: Step-through pattern;Decreased stride length;Antalgic;Decreased stance time - right   Gait velocity interpretation: Below normal speed for age/gender General Gait Details: pt initiated gait with RW but removed RW after 75'. Pt with steady gait with decreased stride and decreased stance RLE but no LOB   Stairs Stairs: Yes   Stair Management: Step to pattern;Forwards;One rail Left Number of Stairs: 11 General stair comments: cues for sequence with pt able to complete without difficulty with rail   Wheelchair Mobility    Modified Rankin (Stroke Patients Only)       Balance Overall balance assessment: No apparent balance deficits (not formally assessed)                                          Cognition Arousal/Alertness: Awake/alert Behavior  During Therapy: WFL for tasks assessed/performed Overall Cognitive Status: Within Functional Limits for tasks assessed                                        Exercises Total Joint Exercises Hip ABduction/ADduction: AROM;Right;15 reps;Standing Knee Flexion: AROM;Right;Standing;15 reps Marching in Standing: AROM;Right;Standing;15 reps Standing Hip Extension: AROM;Right;Standing;15 reps    General Comments        Pertinent Vitals/Pain Pain Score: 2  Pain Location: Rt hip  Pain Descriptors / Indicators: Sore Pain Intervention(s): Limited activity within patient's tolerance;Repositioned;Monitored during session    Home Living                      Prior Function            PT Goals (current goals can now be found in the care plan section) Progress towards PT goals: Progressing toward goals    Frequency    7X/week      PT Plan Current plan remains appropriate    Co-evaluation              AM-PAC PT "6 Clicks" Daily Activity  Outcome Measure  Difficulty turning over in bed (including adjusting bedclothes, sheets and blankets)?: None Difficulty moving from lying on back to sitting on the side of the  bed? : None Difficulty sitting down on and standing up from a chair with arms (e.g., wheelchair, bedside commode, etc,.)?: None Help needed moving to and from a bed to chair (including a wheelchair)?: None Help needed walking in hospital room?: None Help needed climbing 3-5 steps with a railing? : None 6 Click Score: 24    End of Session Equipment Utilized During Treatment: Gait belt Activity Tolerance: Patient tolerated treatment well Patient left: in chair;with call bell/phone within reach Nurse Communication: Mobility status PT Visit Diagnosis: Difficulty in walking, not elsewhere classified (R26.2)     Time: 3734-2876 PT Time Calculation (min) (ACUTE ONLY): 15 min  Charges:  $Gait Training: 8-22 mins                    G Codes:        Elwyn Reach, [place]    [place] 09/28/2016, 8:52 AM

## 2016-09-28 NOTE — Addendum Note (Signed)
Addendum  created 09/28/16 1634 by Lillia Abed, MD   Anesthesia Attestations filed

## 2016-10-12 DIAGNOSIS — M25651 Stiffness of right hip, not elsewhere classified: Secondary | ICD-10-CM | POA: Diagnosis not present

## 2016-10-12 DIAGNOSIS — Z96641 Presence of right artificial hip joint: Secondary | ICD-10-CM | POA: Diagnosis not present

## 2016-11-09 DIAGNOSIS — Z96641 Presence of right artificial hip joint: Secondary | ICD-10-CM | POA: Diagnosis not present

## 2016-11-09 DIAGNOSIS — Z471 Aftercare following joint replacement surgery: Secondary | ICD-10-CM | POA: Diagnosis not present

## 2016-12-28 DIAGNOSIS — L821 Other seborrheic keratosis: Secondary | ICD-10-CM | POA: Diagnosis not present

## 2016-12-28 DIAGNOSIS — Z85828 Personal history of other malignant neoplasm of skin: Secondary | ICD-10-CM | POA: Diagnosis not present

## 2016-12-30 DIAGNOSIS — R51 Headache: Secondary | ICD-10-CM | POA: Diagnosis not present

## 2016-12-30 DIAGNOSIS — G43719 Chronic migraine without aura, intractable, without status migrainosus: Secondary | ICD-10-CM | POA: Diagnosis not present

## 2016-12-30 DIAGNOSIS — G43019 Migraine without aura, intractable, without status migrainosus: Secondary | ICD-10-CM | POA: Diagnosis not present

## 2017-02-10 ENCOUNTER — Ambulatory Visit (INDEPENDENT_AMBULATORY_CARE_PROVIDER_SITE_OTHER): Payer: 59 | Admitting: Family Medicine

## 2017-02-10 ENCOUNTER — Encounter: Payer: Self-pay | Admitting: Family Medicine

## 2017-02-10 VITALS — BP 122/96 | HR 67 | Temp 97.7°F | Ht 68.0 in | Wt 164.1 lb

## 2017-02-10 DIAGNOSIS — I10 Essential (primary) hypertension: Secondary | ICD-10-CM

## 2017-02-10 DIAGNOSIS — F439 Reaction to severe stress, unspecified: Secondary | ICD-10-CM

## 2017-02-10 LAB — BASIC METABOLIC PANEL
BUN: 20 mg/dL (ref 6–23)
CALCIUM: 9.5 mg/dL (ref 8.4–10.5)
CO2: 28 meq/L (ref 19–32)
Chloride: 100 mEq/L (ref 96–112)
Creatinine, Ser: 1.12 mg/dL (ref 0.40–1.50)
GFR: 73.13 mL/min (ref >60.00)
Glucose, Bld: 90 mg/dL (ref 70–99)
Potassium: 4.2 mEq/L (ref 3.5–5.1)
Sodium: 136 mEq/L (ref 135–145)

## 2017-02-10 LAB — CBC
HEMATOCRIT: 46.3 % (ref 39.0–52.0)
Hemoglobin: 15.6 g/dL (ref 13.0–17.0)
MCHC: 33.6 g/dL (ref 30.0–36.0)
MCV: 92.6 fl (ref 78.0–100.0)
PLATELETS: 247 10*3/uL (ref 150.0–400.0)
RBC: 4.99 Mil/uL (ref 4.22–5.81)
RDW: 14.4 % (ref 11.5–15.5)
WBC: 7.1 10*3/uL (ref 4.0–10.5)

## 2017-02-10 MED ORDER — LOSARTAN POTASSIUM 50 MG PO TABS: 50.0000 mg | ORAL_TABLET | Freq: Every day | ORAL | 3 refills | Status: DC

## 2017-02-10 NOTE — Progress Notes (Addendum)
HPI:  Acute visit for elevated blood pressure: -Noticed for the last few months -It was first noticed at his neurology appointment, when the neurologist rechecked it they told him they were not too worried -Exercises on a regular basis, feels like his diet is pretty healthy -2 alcoholic beverages twice weekly -No regular NSAID or over-the-counter use -Family history of hypertension is strong, also coronary artery disease in his father Denies chest pain, shortness of breath, worsening headaches, swelling, palpitations, vision changes or any other symptoms -Reports neurologist started a medicine for his headaches and blood pressure in the past but it worked to good -it was a beta-blocker  Increased stress at work from [employer]. Does not feel had depression or anxiety - will be ongoing for 6 months with merger with company.  ROS: See pertinent positives and negatives per HPI.  Past Medical History:  Diagnosis Date  . History of kidney stones   . Migraine     Past Surgical History:  Procedure Laterality Date  . [place]   right-open removal  . 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 Parkinsonism Mother   . CAD Mother        61's  . Hypertension Father   . Prostate cancer Father 80  . CAD Paternal Grandfather        Massive MI at age 71 - heavy alcohol and tobacco use  . Diabetes Neg Hx     Social History   Socioeconomic History  . Marital status: Married    Spouse name: None  . Number of children: None  . Years of education: None  . Highest education level: None  Social Needs  . Financial resource strain: None  . Food insecurity - worry: None  . Food insecurity - inability: None  . Transportation needs - medical: None  . Transportation  needs - non-medical: None  Occupational History  . None  Tobacco Use  . Smoking status: Never Smoker  . Smokeless tobacco: Never Used  Substance and Sexual Activity  . Alcohol use: Yes    Comment: social  . Drug use: No  . Sexual activity: None  Other Topics Concern  . None  Social History Narrative  . None     Current Outpatient Medications:  .  aspirin 81 MG chewable tablet, Chew 1 tablet (81 mg total) by mouth 2 (two) times daily., Disp: 60 tablet, Rfl: 1 .  Milnacipran (SAVELLA) 50 MG TABS tablet, Take 50 mg by mouth daily., Disp: , Rfl:  .  losartan (COZAAR) 50 MG tablet, Take 1 tablet (50 mg total) by mouth daily., Disp: 90 tablet, Rfl: 3  EXAM:  Vitals:   02/10/17 0920  BP: (!) 122/96  Pulse: 67  Temp: 97.7 F (36.5 C)    Body mass index is 24.95 kg/m.  GENERAL: vitals reviewed and listed above, alert, oriented, appears well hydrated and in no acute distress  HEENT: atraumatic, conjunttiva clear, no obvious abnormalities on inspection of external nose and ears  NECK: no obvious masses on inspection  LUNGS: clear to auscultation bilaterally, no wheezes, rales or rhonchi, good air movement  CV: HRRR, no peripheral edema  MS: moves all extremities without noticeable abnormality  PSYCH: pleasant and cooperative, no obvious depression or anxiety  ASSESSMENT AND PLAN:  Discussed the following assessment and plan:  Essential hypertension - Plan: Basic metabolic panel, CBC  Stress  -Lifestyle Recommendations -Advised treatment for stress and he will consider counseling -Opted to starting medication discussed various options and risks, he has decided to start losartan -Labs today -Follow-up 1 month -Patient advised to return or notify a doctor immediately if symptoms worsen or persist or new concerns arise.  Patient Instructions  BEFORE YOU LEAVE: -Labs  -follow up: 1 month, bring blood pressure cuff  Start the losartan 50 mg once daily in the  morning, once we get the lab results back.  Continue a healthy diet diet, regular exercise and avoidance of excessive salt or alcohol.    Colin Benton R., DO

## 2017-02-10 NOTE — Patient Instructions (Signed)
BEFORE YOU LEAVE: -Labs  -follow up: 1 month, bring blood pressure cuff  Start the losartan 50 mg once daily in the morning, once we get the lab results back.  Continue a healthy diet diet, regular exercise and avoidance of excessive salt or alcohol.

## 2017-03-03 DIAGNOSIS — D225 Melanocytic nevi of trunk: Secondary | ICD-10-CM | POA: Diagnosis not present

## 2017-03-03 DIAGNOSIS — L821 Other seborrheic keratosis: Secondary | ICD-10-CM | POA: Diagnosis not present

## 2017-03-03 DIAGNOSIS — Z85828 Personal history of other malignant neoplasm of skin: Secondary | ICD-10-CM | POA: Diagnosis not present

## 2017-03-17 ENCOUNTER — Ambulatory Visit: Payer: 59 | Admitting: Family Medicine

## 2017-03-19 NOTE — Progress Notes (Signed)
HPI:  HTN: -meds: started losartan last visit and advised CBT for stress and lifestyle changes -Reports he is doing well, eating healthy trying to exercise -Feels better on the medication -No chest pain, shortness of breath or swelling -Stress is improving, he thinks it will be better when things change at work in a few months  ROS: See pertinent positives and negatives per HPI.  Past Medical History:  Diagnosis Date  . History of kidney stones   . Migraine     Past Surgical History:  Procedure Laterality Date  . [place]   right-open removal  . 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 Parkinsonism Mother   . CAD Mother        61's  . Hypertension Father   . Prostate cancer Father 54  . CAD Paternal Grandfather        Massive MI at age 85 - heavy alcohol and tobacco use  . Diabetes Neg Hx     Social History   Socioeconomic History  . Marital status: Married    Spouse name: None  . Number of children: None  . Years of education: None  . Highest education level: None  Social Needs  . Financial resource strain: None  . Food insecurity - worry: None  . Food insecurity - inability: None  . Transportation needs - medical: None  . Transportation needs - non-medical: None  Occupational History  . None  Tobacco Use  . Smoking status: Never Smoker  . Smokeless tobacco: Never Used  Substance and Sexual Activity  . Alcohol use: Yes    Comment: social  . Drug use: No  . Sexual activity: None  Other Topics Concern  . None  Social History Narrative  . None     Current Outpatient Medications:  .  aspirin 81 MG chewable tablet, Chew 1 tablet (81 mg total) by mouth 2 (two) times daily., Disp: 60 tablet, Rfl: 1 .  losartan (COZAAR) 50  MG tablet, Take 1 tablet (50 mg total) by mouth daily., Disp: 90 tablet, Rfl: 3 .  Milnacipran (SAVELLA) 50 MG TABS tablet, Take 50 mg by mouth daily., Disp: , Rfl:   EXAM:  Vitals:   03/21/17 1126  BP: 102/70  Pulse: 72  Temp: 97.6 F (36.4 C)    Body mass index is 24.89 kg/m.  GENERAL: vitals reviewed and listed above, alert, oriented, appears well hydrated and in no acute distress  HEENT: atraumatic, conjunttiva clear, no obvious abnormalities on inspection of external nose and ears  NECK: no obvious masses on inspection  LUNGS: clear to auscultation bilaterally, no wheezes, rales or rhonchi, good air movement  CV: HRRR, no peripheral edema  MS: moves all extremities without noticeable abnormality  PSYCH: pleasant and cooperative, no obvious depression or anxiety  ASSESSMENT AND PLAN:  Discussed the following assessment and plan:  Essential hypertension - Plan: Basic metabolic panel  Stress  Colon cancer screening  -Doing well -Labs today -Discussed the natural course of high blood pressure and that usually once a medication is started, it is continued.  However, in his situation of stress significantly changes, he may be able to come off of this medication in the future with close observation. -Advised he needs to do his  colon cancer screening, he has the Cologuard test at home and agrees to complete this -advised him to check with the company to make sure it is not expired and let us know if he can help. -Discussed treatment options for stress, he feels like this will even out in April when things change at work -Patient advised to return or notify a doctor immediately if symptoms worsen or persist or new concerns arise.  Patient Instructions  BEFORE YOU LEAVE: -Labs  -follow up: 3 months  Please complete your colon cancer screening.  Let us know if we can help.  Check with your pharmacy about the medication recalls.  We have ordered labs or studies at  this visit. It can take up to 1-2 weeks for results and processing. IF results require follow up or explanation, we will call you with instructions. Clinically stable results will be released to your [facility]. If you have not heard from [place] or cannot find your results in [facility] in 2 weeks please contact our office at [phone].  If you are not yet signed up for [facility], please consider signing up.           Lucretia Kern, DO

## 2017-03-21 ENCOUNTER — Encounter: Payer: Self-pay | Admitting: Family Medicine

## 2017-03-21 ENCOUNTER — Ambulatory Visit (INDEPENDENT_AMBULATORY_CARE_PROVIDER_SITE_OTHER): Payer: 59 | Admitting: Family Medicine

## 2017-03-21 VITALS — BP 102/70 | HR 72 | Temp 97.6°F | Ht 68.0 in | Wt 163.7 lb

## 2017-03-21 DIAGNOSIS — I10 Essential (primary) hypertension: Secondary | ICD-10-CM | POA: Diagnosis not present

## 2017-03-21 DIAGNOSIS — Z1211 Encounter for screening for malignant neoplasm of colon: Secondary | ICD-10-CM | POA: Diagnosis not present

## 2017-03-21 DIAGNOSIS — F439 Reaction to severe stress, unspecified: Secondary | ICD-10-CM

## 2017-03-21 LAB — BASIC METABOLIC PANEL
BUN: 11 mg/dL (ref 6–23)
CHLORIDE: 101 meq/L (ref 96–112)
CO2: 26 meq/L (ref 19–32)
Calcium: 8.8 mg/dL (ref 8.4–10.5)
Creatinine, Ser: 0.96 mg/dL (ref 0.40–1.50)
GFR: 87.33 mL/min (ref >60.00)
GLUCOSE: 89 mg/dL (ref 70–99)
POTASSIUM: 4.2 meq/L (ref 3.5–5.1)
SODIUM: 135 meq/L (ref 135–145)

## 2017-03-21 NOTE — Patient Instructions (Signed)
BEFORE YOU LEAVE: -Labs  -follow up: 3 months  Please complete your colon cancer screening.  Let us know if we can help.  Check with your pharmacy about the medication recalls.  We have ordered labs or studies at this visit. It can take up to 1-2 weeks for results and processing. IF results require follow up or explanation, we will call you with instructions. Clinically stable results will be released to your [facility]. If you have not heard from [place] or cannot find your results in [facility] in 2 weeks please contact our office at [phone].  If you are not yet signed up for [facility], please consider signing up.

## 2017-03-29 DIAGNOSIS — Z471 Aftercare following joint replacement surgery: Secondary | ICD-10-CM | POA: Diagnosis not present

## 2017-03-29 DIAGNOSIS — Z96641 Presence of right artificial hip joint: Secondary | ICD-10-CM | POA: Diagnosis not present

## 2017-03-29 DIAGNOSIS — M1611 Unilateral primary osteoarthritis, right hip: Secondary | ICD-10-CM | POA: Diagnosis not present

## 2017-04-22 DIAGNOSIS — W01198A Fall on same level from slipping, tripping and stumbling with subsequent striking against other object, initial encounter: Secondary | ICD-10-CM | POA: Diagnosis not present

## 2017-04-22 DIAGNOSIS — S0101XA Laceration without foreign body of scalp, initial encounter: Secondary | ICD-10-CM | POA: Diagnosis not present

## 2017-04-22 DIAGNOSIS — Z23 Encounter for immunization: Secondary | ICD-10-CM | POA: Diagnosis not present

## 2017-06-20 ENCOUNTER — Ambulatory Visit: Payer: 59 | Admitting: Family Medicine

## 2017-06-28 ENCOUNTER — Ambulatory Visit (INDEPENDENT_AMBULATORY_CARE_PROVIDER_SITE_OTHER): Payer: 59 | Admitting: Family Medicine

## 2017-06-28 ENCOUNTER — Encounter: Payer: Self-pay | Admitting: Family Medicine

## 2017-06-28 VITALS — BP 118/78 | HR 62 | Temp 98.1°F | Ht 68.0 in | Wt 166.5 lb

## 2017-06-28 DIAGNOSIS — K122 Cellulitis and abscess of mouth: Secondary | ICD-10-CM

## 2017-06-28 DIAGNOSIS — I10 Essential (primary) hypertension: Secondary | ICD-10-CM

## 2017-06-28 DIAGNOSIS — F439 Reaction to severe stress, unspecified: Secondary | ICD-10-CM

## 2017-06-28 DIAGNOSIS — J029 Acute pharyngitis, unspecified: Secondary | ICD-10-CM

## 2017-06-28 NOTE — Patient Instructions (Addendum)
BEFORE YOU LEAVE: -strep test -labs -follow up: 1 week to recheck throat  PLEASE COMPLETE the cologuard test this week.  Gargle warm salt water.  Drink plenty of water.  Tylenol or aleve if needed. Please stop the aspirin unless you have a good reason to take.  Antibiotic if not improving.  We have ordered labs or studies at this visit. It can take up to 1-2 weeks for results and processing. IF results require follow up or explanation, we will call you with instructions. Clinically stable results will be released to your [facility]. If you have not heard from [place] or cannot find your results in [facility] in 2 weeks please contact our office at [phone].  If you are not yet signed up for [facility], please consider signing up.

## 2017-06-28 NOTE — Progress Notes (Signed)
HPI:  Using dictation device. Unfortunately this device frequently misinterprets words/phrases. Due for labs, ? [place], colon cancer screening  Follow up HTN: -meds: started losartan and advised CBT for stress and lifestyle changes -Reports he is doing well, eating healthy trying to exercise -Feels better on the medication -No chest pain, shortness of breath or swelling -Stress is improving, and job stress just finished up so he feels will be much better  New issue of sore throat. Started yesterday. No fever, malaise, dysphagia, SOB, NV, sinus congestion.  We have advised him on numerous occassions to complete colon cancer screening, ordered cologuard, he had at home at last visit and advised him to complete  ROS: See pertinent positives and negatives per HPI.  Past Medical History:  Diagnosis Date  . History of kidney stones   . Migraine     Past Surgical History:  Procedure Laterality Date  . [place]   right-open removal  . 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 Parkinsonism Mother   . CAD Mother        47's  . Hypertension Father   . Prostate cancer Father 83  . CAD Paternal Grandfather        Massive MI at age 34 - heavy alcohol and tobacco use  . Diabetes Neg Hx     SOCIAL HX: see hpi   Current Outpatient Medications:  .  aspirin 81 MG chewable tablet, Chew 1 tablet (81 mg total) by mouth 2 (two) times daily., Disp: 60 tablet, Rfl: 1 .  losartan (COZAAR) 50 MG tablet, Take 1 tablet (50 mg total) by mouth daily., Disp: 90 tablet, Rfl: 3 .  Milnacipran (SAVELLA) 50 MG TABS tablet, Take 50 mg by mouth daily., Disp: , Rfl:   EXAM:  Vitals:   06/28/17 1342  BP: 118/78  Pulse: 62  Temp: 98.1 F (36.7 C)    Body mass index is  25.32 kg/m.  GENERAL: vitals reviewed and listed above, alert, oriented, appears well hydrated and in no acute distress  HEENT: atraumatic, conjunttiva clear, no obvious abnormalities on inspection of external nose and ears, normal appearance of ear canals and TMs, clear nasal congestion, mild post oropharyngeal erythema with mildly swollen and erythematous uvula w/out exudate with PND, no tonsillar edema or exudate, no sinus TTP  NECK: no obvious masses on inspection  LUNGS: clear to auscultation bilaterally, no wheezes, rales or rhonchi, good air movement  CV: HRRR, no peripheral edema  MS: moves all extremities without noticeable abnormality  PSYCH: pleasant and cooperative, no obvious depression or anxiety  ASSESSMENT AND PLAN:  Discussed the following assessment and plan:  Essential hypertension  Uvulitis  Stress  -strep testing neg, culture pending, salt water gargles for likely viral illness - abx if not improving or worsening - he agrees to call -continue ot monitor BP - improving -glad stress improving -advised to do colon cancer screening asap, offered to reorder - he prefers to check with company on kit he has at home -follow up 1 week to recheck throat, sooner as needed -labs today -Patient advised to return or notify a doctor immediately if symptoms worsen or persist or new concerns arise.  Patient Instructions  BEFORE YOU LEAVE: -strep test -labs -follow up: 1 week  to recheck throat  PLEASE COMPLETE the cologuard test this week.  Gargle warm salt water.  Drink plenty of water.  Tylenol or aleve if needed. Please stop the aspirin unless you have a good reason to take.  Antibiotic if not improving.  We have ordered labs or studies at this visit. It can take up to 1-2 weeks for results and processing. IF results require follow up or explanation, we will call you with instructions. Clinically stable results will be released to your [facility]. If you have not  heard from [place] or cannot find your results in [facility] in 2 weeks please contact our office at [phone].  If you are not yet signed up for [facility], please consider signing up.           Lucretia Kern, DO

## 2017-06-28 NOTE — Addendum Note (Signed)
Addended by: Agnes Lawrence on: 06/28/2017 02:40 PM   Modules accepted: Orders

## 2017-06-30 LAB — CULTURE, GROUP A STREP
MICRO NUMBER:: 90640513
SPECIMEN QUALITY:: ADEQUATE

## 2017-07-05 ENCOUNTER — Ambulatory Visit: Payer: 59 | Admitting: Family Medicine

## 2017-08-30 DIAGNOSIS — Z85828 Personal history of other malignant neoplasm of skin: Secondary | ICD-10-CM | POA: Diagnosis not present

## 2017-08-30 DIAGNOSIS — L570 Actinic keratosis: Secondary | ICD-10-CM | POA: Diagnosis not present

## 2017-10-25 DIAGNOSIS — H43391 Other vitreous opacities, right eye: Secondary | ICD-10-CM | POA: Diagnosis not present

## 2017-10-25 DIAGNOSIS — H35413 Lattice degeneration of retina, bilateral: Secondary | ICD-10-CM | POA: Diagnosis not present

## 2017-10-25 DIAGNOSIS — H5319 Other subjective visual disturbances: Secondary | ICD-10-CM | POA: Diagnosis not present

## 2017-12-12 ENCOUNTER — Telehealth: Payer: Self-pay

## 2017-12-12 DIAGNOSIS — Z1211 Encounter for screening for malignant neoplasm of colon: Secondary | ICD-10-CM

## 2017-12-12 NOTE — Telephone Encounter (Signed)
Copied from [place] [id number]. Topic: Referral - Request for Referral >> Dec 12, 2017  4:43 PM Percell Belt A wrote: Has patient seen PCP for this complaint? No  *If NO, is insurance requiring patient see PCP for this issue before PCP can refer them? Yes  Referral for which specialty: GI for a   COLONOSCOPY  Preferred provider/office: Anyone that can get him in by the end of [place]  Reason for referral: This is just for a routine Colonoscopy.  He was suppose to have it done a few years ago  Best number [phone]

## 2017-12-15 IMAGING — DX DG PORTABLE PELVIS
1 series · 1 of 1 positions shown · non-contrast
Comparison: February 20, 2016 right hip series.

CLINICAL DATA: Status post right hip joint replacement.

EXAM:
PORTABLE PELVIS 1-2 VIEWS

[pelvis ap]
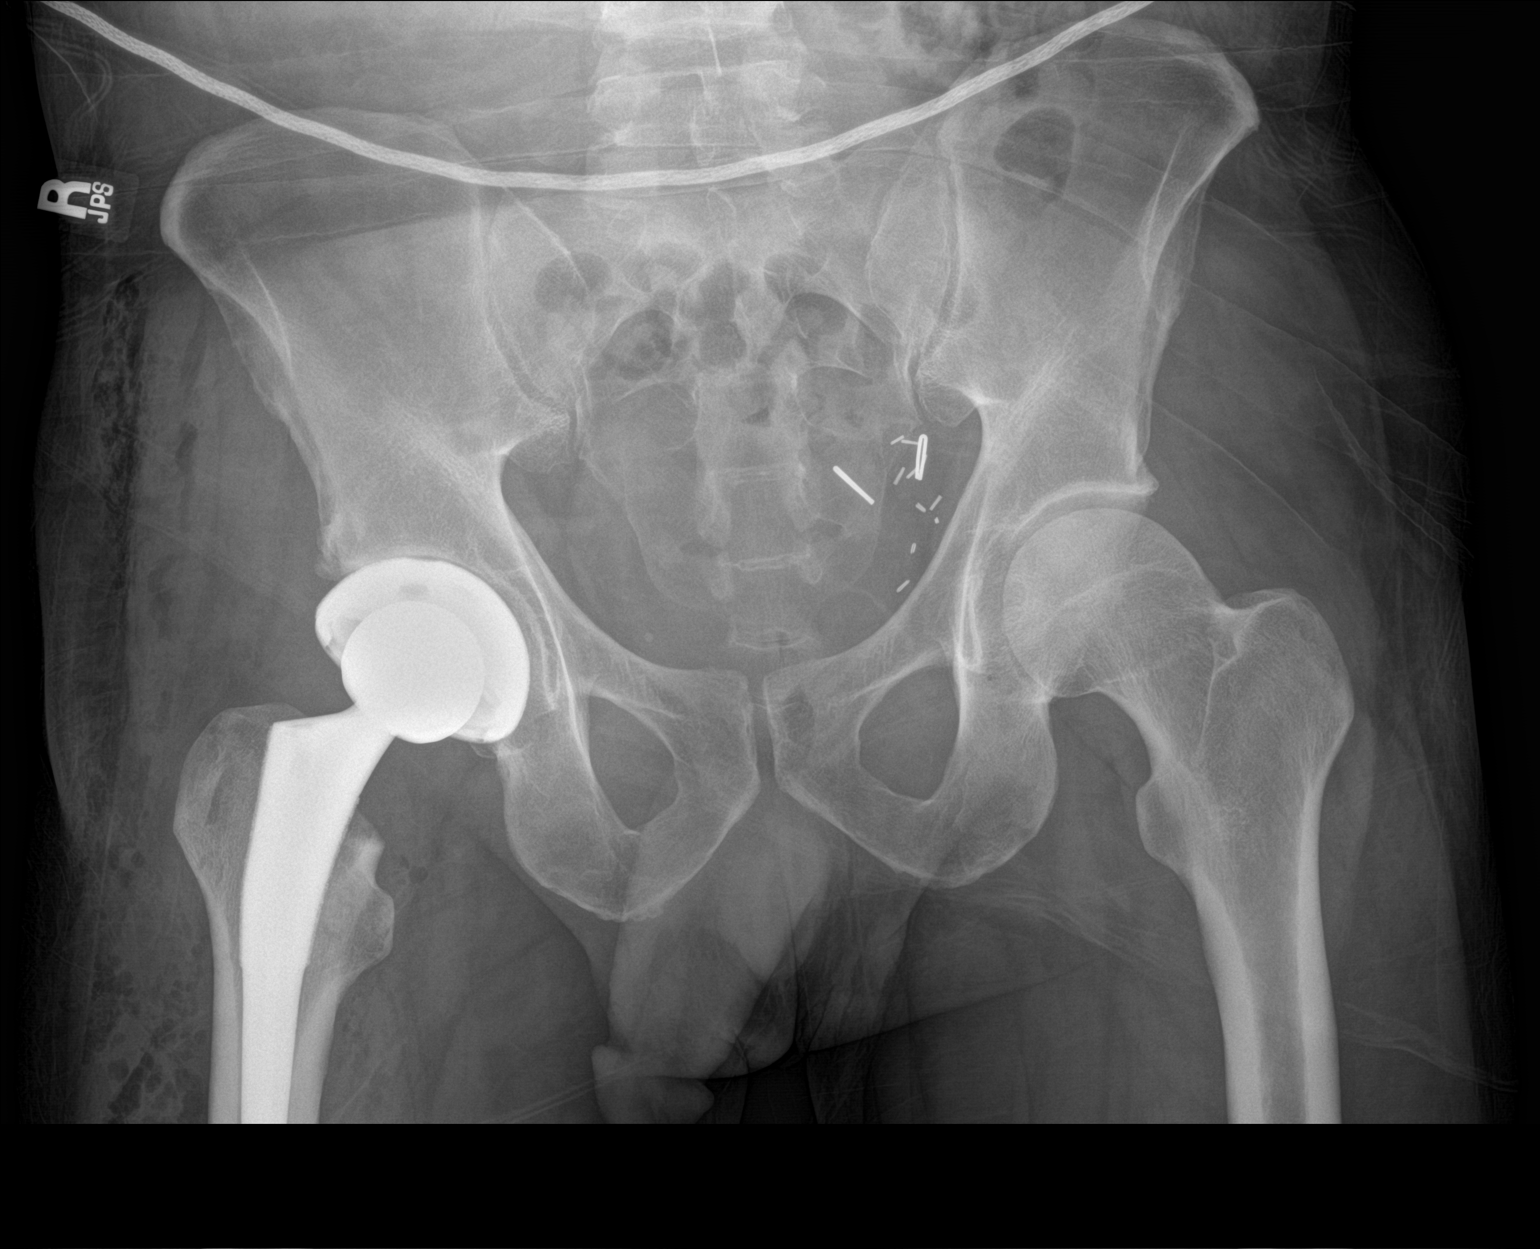

[1 of 1 positions shown; findings below may reference images not displayed]

FINDINGS: The patient has undergone placement of a prosthetic right hip joint.
The interface with the native bone appears normal. No acute native
bone abnormality is observed.
IMPRESSION: No immediate postprocedure complication following right hip joint
prosthesis placement.

## 2017-12-15 IMAGING — RF DG HIP (WITH PELVIS) OPERATIVE*R*
1 series · 2 of 2 positions shown · non-contrast
Comparison: None.

CLINICAL DATA: Right hip placement

EXAM:
DG C-ARM 61-120 MIN; OPERATIVE RIGHT HIP WITH PELVIS

[Series 1: run · 2 of 2 slices shown]
[im 1/2]
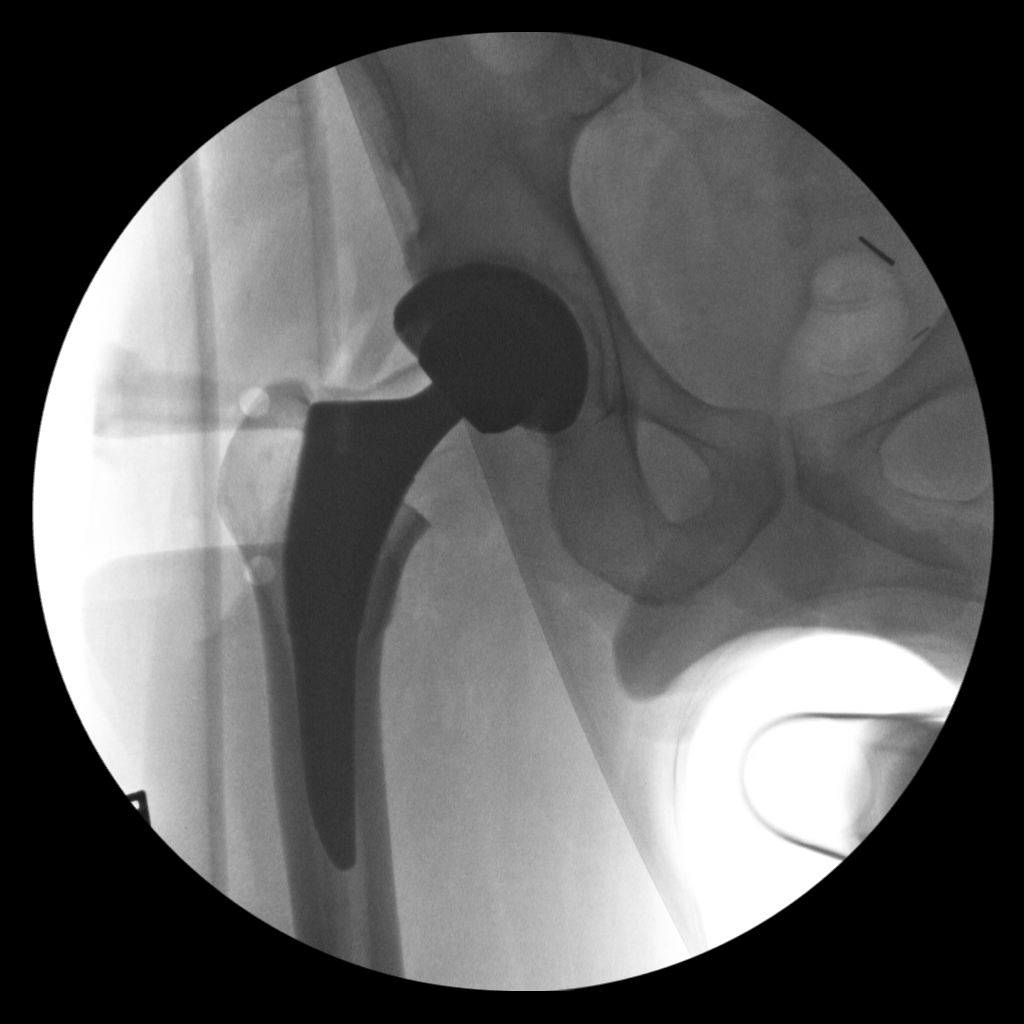
[im 2/2]
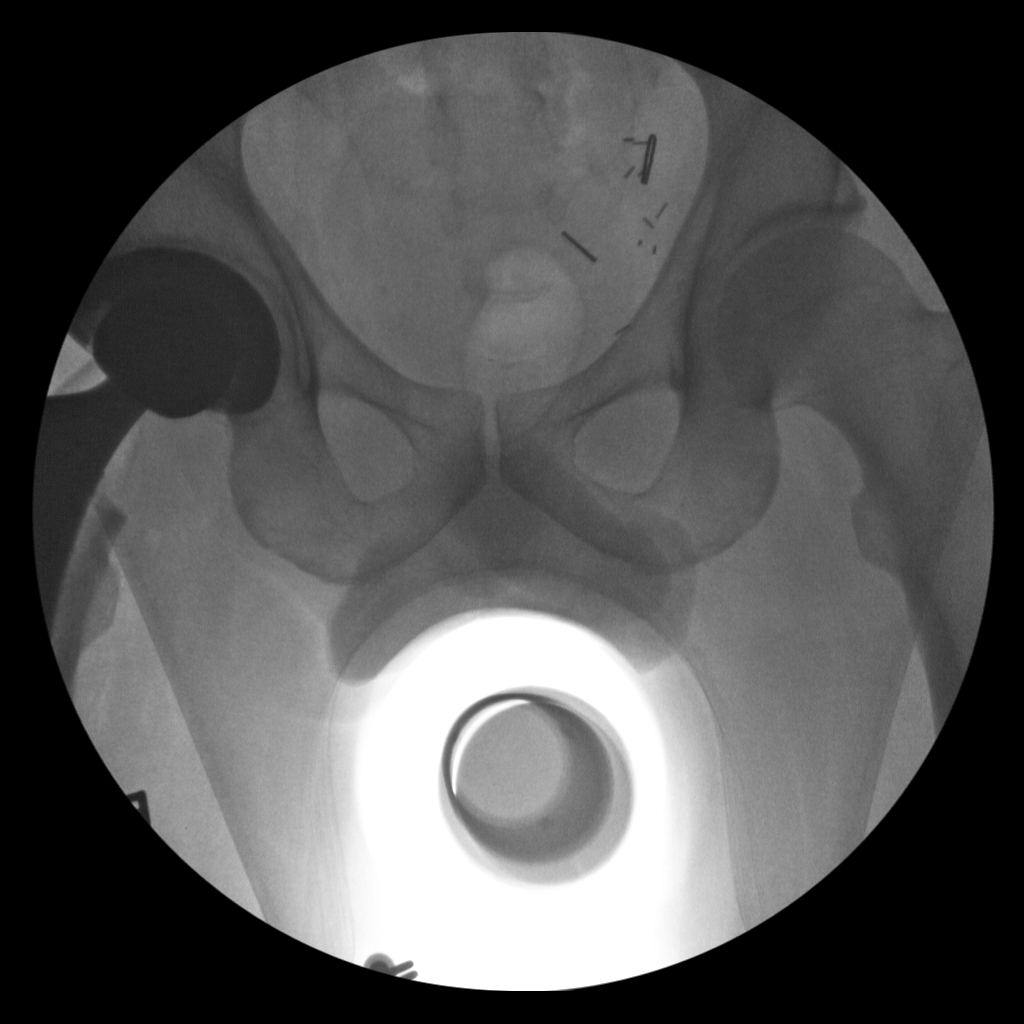

[2 of 2 positions shown; findings below may reference images not displayed]

FINDINGS: Changes of right hip replacement noted. Normal AP alignment. No
hardware bony complicating feature noted.
IMPRESSION: Right hip replacement.  No complicating feature.

## 2017-12-15 NOTE — Telephone Encounter (Signed)
Okay to refer? 

## 2017-12-16 NOTE — Telephone Encounter (Signed)
Order placed.  I called the pt and left a detailed message the order was placed and I am not sure that this can be done by the end of December.

## 2018-01-06 ENCOUNTER — Encounter: Payer: Self-pay | Admitting: Gastroenterology

## 2018-02-02 ENCOUNTER — Ambulatory Visit (AMBULATORY_SURGERY_CENTER): Payer: Self-pay | Admitting: *Deleted

## 2018-02-02 ENCOUNTER — Other Ambulatory Visit: Payer: Self-pay

## 2018-02-02 VITALS — Ht 68.0 in | Wt 170.0 lb

## 2018-02-02 DIAGNOSIS — Z1211 Encounter for screening for malignant neoplasm of colon: Secondary | ICD-10-CM

## 2018-02-02 MED ORDER — SUPREP BOWEL PREP KIT 17.5-3.13-1.6 GM/177ML PO SOLN: 1.0000 | Freq: Once | ORAL | 0 refills | Status: AC

## 2018-02-02 NOTE — Progress Notes (Signed)
No egg or soy allergy known to patient  No issues with past sedation with any surgeries  or procedures, no intubation problems  No diet pills per patient No home 02 use per patient  No blood thinners per patient  Pt denies issues with constipation  No A fib or A flutter  EMMI video sent to pt's e mail  Suprep 15.00 coupon given

## 2018-02-03 ENCOUNTER — Encounter: Payer: Self-pay | Admitting: Gastroenterology

## 2018-02-17 ENCOUNTER — Ambulatory Visit (AMBULATORY_SURGERY_CENTER): Payer: BLUE CROSS/BLUE SHIELD | Admitting: Gastroenterology

## 2018-02-17 ENCOUNTER — Encounter: Payer: Self-pay | Admitting: Gastroenterology

## 2018-02-17 VITALS — BP 104/56 | HR 79 | Temp 98.4°F | Resp 9 | Ht 68.0 in | Wt 170.0 lb

## 2018-02-17 DIAGNOSIS — Z1211 Encounter for screening for malignant neoplasm of colon: Secondary | ICD-10-CM

## 2018-02-17 DIAGNOSIS — D125 Benign neoplasm of sigmoid colon: Secondary | ICD-10-CM | POA: Diagnosis not present

## 2018-02-17 MED ORDER — SODIUM CHLORIDE 0.9 % IV SOLN: 500.0000 mL | Freq: Once | INTRAVENOUS | Status: DC

## 2018-02-17 NOTE — Progress Notes (Signed)
Called to room to assist during endoscopic procedure.  Patient ID and intended procedure confirmed with present staff. Received instructions for my participation in the procedure from the performing physician.  

## 2018-02-17 NOTE — Progress Notes (Signed)
Pt's states no medical or surgical changes since previsit or office visit. 

## 2018-02-17 NOTE — Progress Notes (Signed)
PT taken to PACU. Monitors in place. VSS. Report given to RN. 

## 2018-02-17 NOTE — Op Note (Signed)
[facility] Patient Name: John Carey Procedure Date: 02/17/2018 9:08 AM MRN: [id number] Endoscopist: Remo Lipps P. Havery Moros , MD Age: 53 Referring MD:  Date of Birth: September 11, 1965 Gender: Male Account #: [id number] Procedure:                Colonoscopy Indications:              Screening for colorectal malignant neoplasm, This                            is the patient's first colonoscopy Medicines:                Monitored Anesthesia Care Procedure:                Pre-Anesthesia Assessment:                           - Prior to the procedure, a History and Physical                            was performed, and patient medications and                            allergies were reviewed. The patient's tolerance of                            previous anesthesia was also reviewed. The risks                            and benefits of the procedure and the sedation                            options and risks were discussed with the patient.                            All questions were answered, and informed consent                            was obtained. Prior Anticoagulants: The patient has                            taken no previous anticoagulant or antiplatelet                            agents. ASA Grade Assessment: II - A patient with                            mild systemic disease. After reviewing the risks                            and benefits, the patient was deemed in                            satisfactory condition to undergo the procedure.  After obtaining informed consent, the colonoscope                            was passed under direct vision. Throughout the                            procedure, the patient's blood pressure, pulse, and                            oxygen saturations were monitored continuously. The                            Colonoscope was introduced through the anus and                            advanced to the the  cecum, identified by                            appendiceal orifice and ileocecal valve. The                            colonoscopy was performed without difficulty. The                            patient tolerated the procedure well. The quality                            of the bowel preparation was good. The ileocecal                            valve, appendiceal orifice, and rectum were                            photographed. Scope In: 9:13:53 AM Scope Out: 9:36:22 AM Scope Withdrawal Time: 0 hours 19 minutes 50 seconds  Total Procedure Duration: 0 hours 22 minutes 29 seconds  Findings:                 The perianal and digital rectal examinations were                            normal.                           Two sessile polyps were found in the sigmoid colon.                            The polyps were diminutive in size. These polyps                            were removed with a cold snare. Resection and                            retrieval were complete.  A few small-mouthed diverticula were found in the                            sigmoid colon.                           There was spasm in the sigmoid colon.                           The exam was otherwise without abnormality. Complications:            No immediate complications. Estimated blood loss:                            Minimal. Estimated Blood Loss:     Estimated blood loss was minimal. Impression:               - Two diminutive polyps in the sigmoid colon,                            removed with a cold snare. Resected and retrieved.                           - Diverticulosis in the sigmoid colon.                           - Colonic spasm.                           - The examination was otherwise normal. Recommendation:           - Patient has a contact number available for                            emergencies. The signs and symptoms of potential                            delayed  complications were discussed with the                            patient. Return to normal activities tomorrow.                            Written discharge instructions were provided to the                            patient.                           - Resume previous diet.                           - Continue present medications.                           - Await pathology results. Remo Lipps P. Whitney Hillegass, MD 02/17/2018 9:39:28 AM This report has been signed electronically.

## 2018-02-17 NOTE — Patient Instructions (Signed)
Impression/Recommendations:  Polyp handout given to patient. Diverticulosis handout given to patient.  Resume previous diet. Continue present medications.  Await pathology results.  YOU HAD AN ENDOSCOPIC PROCEDURE TODAY AT [facility]:   Refer to the procedure report that was given to you for any specific questions about what was found during the examination.  If the procedure report does not answer your questions, please call your gastroenterologist to clarify.  If you requested that your care partner not be given the details of your procedure findings, then the procedure report has been included in a sealed envelope for you to review at your convenience later.  YOU SHOULD EXPECT: Some feelings of bloating in the abdomen. Passage of more gas than usual.  Walking can help get rid of the air that was put into your GI tract during the procedure and reduce the bloating. If you had a lower endoscopy (such as a colonoscopy or flexible sigmoidoscopy) you may notice spotting of blood in your stool or on the toilet paper. If you underwent a bowel prep for your procedure, you may not have a normal bowel movement for a few days.  Please Note:  You might notice some irritation and congestion in your nose or some drainage.  This is from the oxygen used during your procedure.  There is no need for concern and it should clear up in a day or so.  SYMPTOMS TO REPORT IMMEDIATELY:   Following lower endoscopy (colonoscopy or flexible sigmoidoscopy):  Excessive amounts of blood in the stool  Significant tenderness or worsening of abdominal pains  Swelling of the abdomen that is new, acute  Fever of 100F or higher  For urgent or emergent issues, a gastroenterologist can be reached at any hour by calling [phone].   DIET:  We do recommend a small meal at first, but then you may proceed to your regular diet.  Drink plenty of fluids but you should avoid alcoholic beverages for 24  hours.  ACTIVITY:  You should plan to take it easy for the rest of today and you should NOT DRIVE or use heavy machinery until tomorrow (because of the sedation medicines used during the test).    FOLLOW UP: Our staff will call the number listed on your records the next business day following your procedure to check on you and address any questions or concerns that you may have regarding the information given to you following your procedure. If we do not reach you, we will leave a message.  However, if you are feeling well and you are not experiencing any problems, there is no need to return our call.  We will assume that you have returned to your regular daily activities without incident.  If any biopsies were taken you will be contacted by phone or by letter within the next 1-3 weeks.  Please call us at [phone] if you have not heard about the biopsies in 3 weeks.    SIGNATURES/CONFIDENTIALITY: You and/or your care partner have signed paperwork which will be entered into your electronic medical record.  These signatures attest to the fact that that the information above on your After Visit Summary has been reviewed and is understood.  Full responsibility of the confidentiality of this discharge information lies with you and/or your care-partner.

## 2018-02-20 ENCOUNTER — Telehealth: Payer: Self-pay

## 2018-02-20 NOTE — Telephone Encounter (Signed)
Follow up call made, name identifier left a voicemail.

## 2018-02-20 NOTE — Telephone Encounter (Signed)
  Follow up Call-  Call back number 02/17/2018  Post procedure Call Back phone  # [phone] cell  Permission to leave phone message Yes  Some recent data might be hidden     Patient questions:  Do you have a fever, pain , or abdominal swelling? No. Pain Score  0 *  Have you tolerated food without any problems? Yes.    Have you been able to return to your normal activities? Yes.    Do you have any questions about your discharge instructions: Diet   No. Medications  No. Follow up visit  No.  Do you have questions or concerns about your Care? No.  Actions: * If pain score is 4 or above: No action needed, pain <4.   No problems noted per pt. maw

## 2018-02-22 ENCOUNTER — Encounter: Payer: Self-pay | Admitting: Gastroenterology

## 2018-02-28 DIAGNOSIS — Z85828 Personal history of other malignant neoplasm of skin: Secondary | ICD-10-CM | POA: Diagnosis not present

## 2018-02-28 DIAGNOSIS — L821 Other seborrheic keratosis: Secondary | ICD-10-CM | POA: Diagnosis not present

## 2018-02-28 DIAGNOSIS — L578 Other skin changes due to chronic exposure to nonionizing radiation: Secondary | ICD-10-CM | POA: Diagnosis not present

## 2018-02-28 DIAGNOSIS — L570 Actinic keratosis: Secondary | ICD-10-CM | POA: Diagnosis not present

## 2018-03-13 ENCOUNTER — Other Ambulatory Visit: Payer: Self-pay | Admitting: Family Medicine

## 2018-04-09 ENCOUNTER — Other Ambulatory Visit: Payer: Self-pay | Admitting: Family Medicine

## 2018-05-21 ENCOUNTER — Other Ambulatory Visit: Payer: Self-pay | Admitting: Family Medicine

## 2018-06-08 DIAGNOSIS — Z7689 Persons encountering health services in other specified circumstances: Secondary | ICD-10-CM | POA: Diagnosis not present

## 2018-06-08 DIAGNOSIS — J039 Acute tonsillitis, unspecified: Secondary | ICD-10-CM | POA: Diagnosis not present

## 2018-06-08 DIAGNOSIS — R590 Localized enlarged lymph nodes: Secondary | ICD-10-CM | POA: Diagnosis not present

## 2018-06-09 DIAGNOSIS — M25521 Pain in right elbow: Secondary | ICD-10-CM | POA: Diagnosis not present

## 2018-06-09 DIAGNOSIS — M7711 Lateral epicondylitis, right elbow: Secondary | ICD-10-CM | POA: Diagnosis not present

## 2018-06-26 ENCOUNTER — Other Ambulatory Visit: Payer: Self-pay | Admitting: Family Medicine

## 2018-06-29 ENCOUNTER — Ambulatory Visit (INDEPENDENT_AMBULATORY_CARE_PROVIDER_SITE_OTHER): Payer: BLUE CROSS/BLUE SHIELD | Admitting: Internal Medicine

## 2018-06-29 ENCOUNTER — Encounter: Payer: Self-pay | Admitting: Internal Medicine

## 2018-06-29 ENCOUNTER — Other Ambulatory Visit: Payer: Self-pay

## 2018-06-29 DIAGNOSIS — I10 Essential (primary) hypertension: Secondary | ICD-10-CM | POA: Diagnosis not present

## 2018-06-29 MED ORDER — LOSARTAN POTASSIUM 50 MG PO TABS: 25.0000 mg | ORAL_TABLET | Freq: Every day | ORAL | 0 refills | Status: DC

## 2018-06-29 NOTE — Progress Notes (Signed)
Virtual Visit via Video Note  I connected with John Carey on 06/29/18 at  2:30 PM EDT by a video enabled telemedicine application and verified that I am speaking with the correct person using two identifiers.  Location patient: home Location provider: work office Persons participating in the virtual visit: patient, provider  I discussed the limitations of evaluation and management by telemedicine and the availability of in person appointments. The patient expressed understanding and agreed to proceed.   HPI: This is a scheduled visit to establish care and for medication refills. He enjoys good health. PMH is only significant for well controlled HTN on losartan 50 mg of which he takes a half tablet daily. He is due for CPE and lab work. He is a never smoker, drinks ETOH occasionally. Fam Hx is significant for dad with prostate cancer and mom with Parkinson's Disease. He has 4 grown children, works at [employer]. No acute complaints today.   ROS: Constitutional: Denies fever, chills, diaphoresis, appetite change and fatigue.  HEENT: Denies photophobia, eye pain, redness, hearing loss, ear pain, congestion, sore throat, rhinorrhea, sneezing, mouth sores, trouble swallowing, neck pain, neck stiffness and tinnitus.   Respiratory: Denies SOB, DOE, cough, chest tightness,  and wheezing.   Cardiovascular: Denies chest pain, palpitations and leg swelling.  Gastrointestinal: Denies nausea, vomiting, abdominal pain, diarrhea, constipation, blood in stool and abdominal distention.  Genitourinary: Denies dysuria, urgency, frequency, hematuria, flank pain and difficulty urinating.  Endocrine: Denies: hot or cold intolerance, sweats, changes in hair or nails, polyuria, polydipsia. Musculoskeletal: Denies myalgias, back pain, joint swelling, arthralgias and gait problem.  Skin: Denies pallor, rash and wound.  Neurological: Denies dizziness, seizures, syncope, weakness, light-headedness, numbness and  headaches.  Hematological: Denies adenopathy. Easy bruising, personal or family bleeding history  Psychiatric/Behavioral: Denies suicidal ideation, mood changes, confusion, nervousness, sleep disturbance and agitation   Past Medical History:  Diagnosis Date  . History of kidney stones   . Hypertension   . Migraine     Past Surgical History:  Procedure Laterality Date  . [place]   right-open removal  . 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 wisdom teeth extraction      Family History  Problem Relation Age of Onset  . Parkinsonism Mother   . CAD Mother        66's  . Hypertension Father   . Prostate cancer Father 77  . CAD Paternal Grandfather        Massive MI at age 56 - heavy alcohol and tobacco use  . Diabetes Neg Hx   . Colon cancer Neg Hx   . Colon polyps Neg Hx   . Esophageal cancer Neg Hx   . Rectal cancer Neg Hx   . Stomach cancer Neg Hx     SOCIAL HX:   reports that he has never smoked. He has never used smokeless tobacco. He reports current alcohol use of about 3.0 standard drinks of alcohol per week. He reports that he does not use drugs.   Current Outpatient Medications:  .  losartan (COZAAR) 50 MG tablet, Take 0.5 tablets (25 mg total) by mouth daily., Disp: 90 tablet, Rfl: 0  EXAM:   VITALS per patient if applicable: none reported  GENERAL: alert, oriented, appears well and in no acute distress  HEENT: atraumatic,  conjunttiva clear, no obvious abnormalities on inspection of external nose and ears  NECK: normal movements of the head and neck  LUNGS: on inspection no signs of respiratory distress, breathing rate appears normal, no obvious gross increased work of breathing, gasping or wheezing  CV: no obvious cyanosis  MS: moves all visible extremities without  noticeable abnormality  PSYCH/NEURO: pleasant and cooperative, no obvious depression or anxiety, speech and thought processing grossly intact  ASSESSMENT AND PLAN:   Essential hypertension  -Have advised he purchase a BP cuff for ambulatory BP monitoring. -Refill losartan. -Needs to come in for BP check as well as BMET to follow renal function and electrolytes.  He will schedule return visit for CPE.     I discussed the assessment and treatment plan with the patient. The patient was provided an opportunity to ask questions and all were answered. The patient agreed with the plan and demonstrated an understanding of the instructions.   The patient was advised to call back or seek an in-person evaluation if the symptoms worsen or if the condition fails to improve as anticipated.    Lelon Frohlich, MD  [facility] at [facility]

## 2018-07-03 DIAGNOSIS — M542 Cervicalgia: Secondary | ICD-10-CM | POA: Diagnosis not present

## 2018-07-27 ENCOUNTER — Encounter: Payer: BLUE CROSS/BLUE SHIELD | Admitting: Internal Medicine

## 2018-11-06 DIAGNOSIS — Z85828 Personal history of other malignant neoplasm of skin: Secondary | ICD-10-CM | POA: Diagnosis not present

## 2018-11-06 DIAGNOSIS — B078 Other viral warts: Secondary | ICD-10-CM | POA: Diagnosis not present

## 2018-11-06 DIAGNOSIS — L570 Actinic keratosis: Secondary | ICD-10-CM | POA: Diagnosis not present

## 2019-03-14 ENCOUNTER — Ambulatory Visit (INDEPENDENT_AMBULATORY_CARE_PROVIDER_SITE_OTHER): Payer: Self-pay | Admitting: Family Medicine

## 2019-03-14 ENCOUNTER — Other Ambulatory Visit: Payer: Self-pay

## 2019-03-14 ENCOUNTER — Encounter: Payer: Self-pay | Admitting: Family Medicine

## 2019-03-14 VITALS — BP 108/68 | HR 54 | Temp 97.8°F | Wt 168.6 lb

## 2019-03-14 DIAGNOSIS — I10 Essential (primary) hypertension: Secondary | ICD-10-CM

## 2019-03-14 NOTE — Patient Instructions (Signed)

## 2019-03-14 NOTE — Progress Notes (Signed)
  Subjective:     Patient ID: John Carey, male   DOB: 12-Jun-1965, 54 y.o.   MRN: [id number]  HPI   [place] is seen as a work in with concerns for elevated blood pressure.  He has diagnosis of hypertension and takes low-dose losartan 25 mg daily.  Compliant with therapy.  He has home cuff and has had some readings recently Q000111Q systolic.  He had one reading of 0000000 systolic.  Diastolics have been consistently well controlled.  He has not had any headaches, dizziness, chest pains, or any other concerning symptoms.  He is currently working from home.  He does exercise few times per week and has good exercise tolerance.  Drinks about 3 ounces of bourbon per day.  No regular nonsteroidal use.  Past Medical History:  Diagnosis Date  . History of kidney stones   . Hypertension   . Migraine    Past Surgical History:  Procedure Laterality Date  . [place]   right-open removal  . 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 wisdom teeth extraction      reports that he has never smoked. He has never used smokeless tobacco. He reports current alcohol use of about 3.0 standard drinks of alcohol per week. He reports that he does not use drugs. family history includes CAD in his mother and paternal grandfather; Hypertension in his father; Parkinsonism in his mother; Prostate cancer (age of onset: 4) in his father. No Known Allergies   Review of Systems  Constitutional: Negative for fatigue and unexpected weight change.  Eyes: Negative for visual disturbance.  Respiratory: Negative for cough, chest tightness and shortness of breath.   Cardiovascular: Negative for chest pain, palpitations and leg swelling.  Neurological: Negative for dizziness, syncope, weakness, light-headedness and headaches.       Objective:   Physical Exam Vitals reviewed.  Constitutional:      Appearance: Normal appearance.  Cardiovascular:     Rate and Rhythm: Normal rate and regular rhythm.     Heart sounds: No murmur.  Pulmonary:     Effort: Pulmonary effort is normal.     Breath sounds: Normal breath sounds.  Neurological:     Mental Status: He is alert.        Assessment:     Hypertension.  Initial reading here today 138/76.  Patient had concerns for some elevating readings with Q000111Q systolic.  Repeat by me right and left arm seated 108/68 after rest    Plan:     -Discussed nonpharmacologic management with weight control, regular exercise, sodium reduction.  Discussed keeping alcohol below 3 ounces of bourbon per day  -Would not titrate losartan further at this time given readings above.  He will continue with losartan 25 mg daily .  we have encouraged him to follow-up with Dr. Jerilee Hoh and to consider bringing his cuff to compare with readings here on future visit.  -Be in touch with primary if consistent blood pressure readings greater than 140/90  Eulas Post MD [facility] at [facility]

## 2019-05-01 DIAGNOSIS — Z20828 Contact with and (suspected) exposure to other viral communicable diseases: Secondary | ICD-10-CM | POA: Diagnosis not present

## 2019-05-01 DIAGNOSIS — Z03818 Encounter for observation for suspected exposure to other biological agents ruled out: Secondary | ICD-10-CM | POA: Diagnosis not present

## 2019-05-05 DIAGNOSIS — Z20828 Contact with and (suspected) exposure to other viral communicable diseases: Secondary | ICD-10-CM | POA: Diagnosis not present

## 2019-06-28 ENCOUNTER — Telehealth: Payer: Self-pay | Admitting: Internal Medicine

## 2019-06-28 NOTE — Telephone Encounter (Signed)
error 

## 2019-07-17 ENCOUNTER — Encounter: Payer: Self-pay | Admitting: Internal Medicine

## 2019-07-18 NOTE — Telephone Encounter (Signed)
[email or web address] sent a fax for medical records release.  The form as been sent to [employer] which is our coping company.

## 2019-08-17 ENCOUNTER — Other Ambulatory Visit: Payer: Self-pay | Admitting: Internal Medicine

## 2019-08-17 DIAGNOSIS — I10 Essential (primary) hypertension: Secondary | ICD-10-CM

## 2020-02-27 DIAGNOSIS — C44519 Basal cell carcinoma of skin of other part of trunk: Secondary | ICD-10-CM | POA: Diagnosis not present

## 2020-02-27 DIAGNOSIS — L718 Other rosacea: Secondary | ICD-10-CM | POA: Diagnosis not present

## 2020-02-27 DIAGNOSIS — B078 Other viral warts: Secondary | ICD-10-CM | POA: Diagnosis not present

## 2020-02-27 DIAGNOSIS — L821 Other seborrheic keratosis: Secondary | ICD-10-CM | POA: Diagnosis not present

## 2020-02-27 DIAGNOSIS — L919 Hypertrophic disorder of the skin, unspecified: Secondary | ICD-10-CM | POA: Diagnosis not present

## 2020-02-27 DIAGNOSIS — D485 Neoplasm of uncertain behavior of skin: Secondary | ICD-10-CM | POA: Diagnosis not present

## 2020-02-27 DIAGNOSIS — Z85828 Personal history of other malignant neoplasm of skin: Secondary | ICD-10-CM | POA: Diagnosis not present

## 2020-03-13 DIAGNOSIS — B078 Other viral warts: Secondary | ICD-10-CM | POA: Diagnosis not present

## 2020-04-16 DIAGNOSIS — B078 Other viral warts: Secondary | ICD-10-CM | POA: Diagnosis not present

## 2020-04-16 DIAGNOSIS — L820 Inflamed seborrheic keratosis: Secondary | ICD-10-CM | POA: Diagnosis not present

## 2020-05-19 ENCOUNTER — Other Ambulatory Visit: Payer: Self-pay | Admitting: Internal Medicine

## 2020-05-19 DIAGNOSIS — I10 Essential (primary) hypertension: Secondary | ICD-10-CM

## 2020-06-24 DIAGNOSIS — B078 Other viral warts: Secondary | ICD-10-CM | POA: Diagnosis not present

## 2020-06-28 ENCOUNTER — Other Ambulatory Visit: Payer: Self-pay | Admitting: Internal Medicine

## 2020-06-28 DIAGNOSIS — I10 Essential (primary) hypertension: Secondary | ICD-10-CM

## 2020-09-26 ENCOUNTER — Other Ambulatory Visit: Payer: Self-pay

## 2020-09-26 ENCOUNTER — Ambulatory Visit (INDEPENDENT_AMBULATORY_CARE_PROVIDER_SITE_OTHER): Payer: BC Managed Care – PPO | Admitting: Internal Medicine

## 2020-09-26 ENCOUNTER — Encounter: Payer: Self-pay | Admitting: Internal Medicine

## 2020-09-26 VITALS — BP 120/80 | HR 67 | Temp 98.2°F | Ht 68.0 in | Wt 169.1 lb

## 2020-09-26 DIAGNOSIS — R1319 Other dysphagia: Secondary | ICD-10-CM

## 2020-09-26 DIAGNOSIS — Z0000 Encounter for general adult medical examination without abnormal findings: Secondary | ICD-10-CM

## 2020-09-26 DIAGNOSIS — Z23 Encounter for immunization: Secondary | ICD-10-CM | POA: Diagnosis not present

## 2020-09-26 DIAGNOSIS — I10 Essential (primary) hypertension: Secondary | ICD-10-CM

## 2020-09-26 MED ORDER — PANTOPRAZOLE SODIUM 40 MG PO TBEC: 40.0000 mg | DELAYED_RELEASE_TABLET | Freq: Every day | ORAL | 1 refills | Status: DC

## 2020-09-26 NOTE — Addendum Note (Signed)
Addended by: Westley Hummer B on: 09/26/2020 02:52 PM   Modules accepted: Orders

## 2020-09-26 NOTE — Patient Instructions (Signed)
-  Nice seeing you today!!  -Return fasting for labs.  -First shingles vaccine today.  -Schedule follow up in 1 year or sooner as needed.

## 2020-09-26 NOTE — Progress Notes (Signed)
Established Patient Office Visit     This visit occurred during the SARS-CoV-2 public health emergency.  Safety protocols were in place, including screening questions prior to the visit, additional usage of staff PPE, and extensive cleaning of exam room while observing appropriate contact time as indicated for disinfecting solutions.    CC/Reason for Visit: Annual preventive exam  HPI: John Carey is a 55 y.o. male who is coming in today for the above mentioned reasons. Past Medical History is significant for: Hypertension.  He stopped taking losartan about a year ago.  His blood pressure has been normal.  He has routine eye and dental care.  He exercises routinely without he had a colonoscopy in 2020 and was advised to 5-year callback.  He has had all 4 COVID vaccines and Tdap.  He is overdue for shingles.  He describes a sensation of hiccups followed by esophageal spasms that sometimes cause dysphagia.  This happens maybe once every month.  He does have a sensation of reflux.   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 He has never used smokeless tobacco. He reports current alcohol use of about 3.0 standard drinks per week. He reports that he does not use drugs.  Allergies: No Known Allergies  Family History:  Family History  Problem Relation Age of Onset   Parkinsonism Mother    CAD Mother        55's   Hypertension Father    Prostate cancer Father 27   CAD Paternal Grandfather        Massive  MI at age 73 - heavy alcohol and tobacco use   Diabetes Neg Hx    Colon cancer Neg Hx    Colon polyps Neg Hx    Esophageal cancer Neg Hx    Rectal cancer Neg Hx    Stomach cancer Neg Hx      Current Outpatient Medications:    pantoprazole (PROTONIX) 40 MG tablet, Take 1 tablet (40 mg total) by mouth daily., Disp: 90 tablet, Rfl: 1   losartan (COZAAR) 50 MG tablet, TAKE 1/2 TABLET BY MOUTH EVERY DAY (Patient not taking: Reported on 09/26/2020), Disp: 15 tablet, Rfl: 0  Review of Systems:  Constitutional: Denies fever, chills, diaphoresis, appetite change and fatigue.  HEENT: Denies photophobia, eye pain, redness, hearing loss, ear pain, congestion, sore throat, rhinorrhea, sneezing, mouth sores, trouble swallowing, neck pain, neck stiffness and tinnitus.   Respiratory: Denies SOB, DOE, cough, chest tightness,  and wheezing.   Cardiovascular: Denies chest pain, palpitations and leg swelling.  Gastrointestinal: Denies nausea, vomiting, abdominal pain, diarrhea, constipation, blood in stool and abdominal distention.  Genitourinary: Denies dysuria, urgency, frequency, hematuria, flank pain and difficulty urinating.  Endocrine: Denies: hot or cold intolerance, sweats, changes in hair or nails, polyuria, polydipsia. Musculoskeletal: Denies myalgias, back pain, joint swelling, arthralgias and gait problem.  Skin: Denies pallor, rash and wound.  Neurological: Denies dizziness, seizures, syncope, weakness, light-headedness, numbness and headaches.  Hematological: Denies adenopathy. Easy bruising, personal or family bleeding history  Psychiatric/Behavioral: Denies suicidal ideation, mood changes, confusion, nervousness, sleep disturbance and agitation    Physical Exam: Vitals:   09/26/20 1415  BP: 120/80  Pulse: 67  Temp: 98.2 F (36.8 C)  TempSrc: Oral  SpO2: 96%  Weight: 169 lb 1.6 oz (76.7 kg)  Height: '5\' 8"'$  (1.727 m)    Body mass index is 25.71 kg/m.   Constitutional: NAD,  calm, comfortable Eyes: PERRL, lids and conjunctivae normal ENMT: Mucous membranes are moist. Posterior pharynx clear of any exudate or lesions. Normal dentition. Tympanic membrane is pearly white, no erythema or bulging. Neck: normal, supple, no masses, no thyromegaly Respiratory: clear to auscultation bilaterally, no wheezing, no crackles. Normal respiratory effort. No accessory muscle use.  Cardiovascular: Regular rate and rhythm, no murmurs / rubs / gallops. No extremity edema. 2+ pedal pulses. No carotid bruits.  Abdomen: no tenderness, no masses palpated. No hepatosplenomegaly. Bowel sounds positive.  Musculoskeletal: no clubbing / cyanosis. No joint deformity upper and lower extremities. Good ROM, no contractures. Normal muscle tone.  Skin: no rashes, lesions, ulcers. No induration Neurologic: CN 2-12 grossly intact. Sensation intact, DTR normal. Strength 5/5 in all 4.  Psychiatric: Normal judgment and insight. Alert and oriented x 3. Normal mood.    Impression and Plan:  Encounter for preventive health examination -Advised routine eye and dental care. -For shingles vaccine today, other immunizations are up-to-date. -Screening labs to be requested. -Healthy lifestyle discussed in detail. -He had a colonoscopy in 2020 and is a 5-year callback.  Primary hypertension  - Plan: CBC with Differential/Platelet, Comprehensive metabolic panel, Hemoglobin A1c, Lipid panel, TSH, Vitamin B12, VITAMIN D 25 Hydroxy (Vit-D Deficiency, Fractures) -Blood pressure is well controlled today. -Okay to stay off medication.  Esophageal dysphagia  - Plan: pantoprazole (PROTONIX) 40 MG tablet -We agreed on a trial of Protonix, if no improvement, can consider referral to GI for EGD, possibly with dilatation.  Need for shingles vaccine -First shingles vaccine administered today.    Patient Instructions  -Nice seeing you today!!  -Return fasting for labs.  -First shingles vaccine  today.  -Schedule follow up in 1 year or sooner as needed.      Lelon Frohlich, MD [facility] at [facility]

## 2021-02-12 ENCOUNTER — Other Ambulatory Visit (INDEPENDENT_AMBULATORY_CARE_PROVIDER_SITE_OTHER): Payer: BC Managed Care – PPO

## 2021-02-12 ENCOUNTER — Encounter: Payer: Self-pay | Admitting: Internal Medicine

## 2021-02-12 ENCOUNTER — Other Ambulatory Visit: Payer: Self-pay | Admitting: Internal Medicine

## 2021-02-12 ENCOUNTER — Ambulatory Visit (INDEPENDENT_AMBULATORY_CARE_PROVIDER_SITE_OTHER): Payer: BC Managed Care – PPO

## 2021-02-12 DIAGNOSIS — I10 Essential (primary) hypertension: Secondary | ICD-10-CM | POA: Diagnosis not present

## 2021-02-12 DIAGNOSIS — Z23 Encounter for immunization: Secondary | ICD-10-CM | POA: Diagnosis not present

## 2021-02-12 DIAGNOSIS — E559 Vitamin D deficiency, unspecified: Secondary | ICD-10-CM

## 2021-02-12 LAB — LIPID PANEL
Cholesterol: 182 mg/dL (ref 0–200)
HDL: 59.6 mg/dL (ref >39.00)
LDL Cholesterol: 108 mg/dL — ABNORMAL HIGH (ref 0–99)
NonHDL: 121.95
Total CHOL/HDL Ratio: 3
Triglycerides: 72 mg/dL (ref 0.0–149.0)
VLDL: 14.4 mg/dL (ref 0.0–40.0)

## 2021-02-12 LAB — CBC WITH DIFFERENTIAL/PLATELET
Basophils Absolute: 0.1 10*3/uL (ref 0.0–0.1)
Basophils Relative: 1.4 % (ref 0.0–3.0)
Eosinophils Absolute: 0.1 10*3/uL (ref 0.0–0.7)
Eosinophils Relative: 1.7 % (ref 0.0–5.0)
HCT: 44.5 % (ref 39.0–52.0)
Hemoglobin: 15 g/dL (ref 13.0–17.0)
Lymphocytes Relative: 22.2 % (ref 12.0–46.0)
Lymphs Abs: 1 10*3/uL (ref 0.7–4.0)
MCHC: 33.7 g/dL (ref 30.0–36.0)
MCV: 92.6 fl (ref 78.0–100.0)
Monocytes Absolute: 0.4 10*3/uL (ref 0.1–1.0)
Monocytes Relative: 8.6 % (ref 3.0–12.0)
Neutro Abs: 3.1 10*3/uL (ref 1.4–7.7)
Neutrophils Relative %: 66.1 % (ref 43.0–77.0)
Platelets: 208 10*3/uL (ref 150.0–400.0)
RBC: 4.81 Mil/uL (ref 4.22–5.81)
RDW: 12.8 % (ref 11.5–15.5)
WBC: 4.7 10*3/uL (ref 4.0–10.5)

## 2021-02-12 LAB — COMPREHENSIVE METABOLIC PANEL
ALT: 21 U/L (ref 0–53)
AST: 28 U/L (ref 0–37)
Albumin: 4.3 g/dL (ref 3.5–5.2)
Alkaline Phosphatase: 48 U/L (ref 39–117)
BUN: 12 mg/dL (ref 6–23)
CO2: 28 mEq/L (ref 19–32)
Calcium: 8.8 mg/dL (ref 8.4–10.5)
Chloride: 99 mEq/L (ref 96–112)
Creatinine, Ser: 1.07 mg/dL (ref 0.40–1.50)
GFR: 77.77 mL/min (ref >60.00)
Glucose, Bld: 83 mg/dL (ref 70–99)
Potassium: 4.2 mEq/L (ref 3.5–5.1)
Sodium: 133 mEq/L — ABNORMAL LOW (ref 135–145)
Total Bilirubin: 1.2 mg/dL (ref 0.2–1.2)
Total Protein: 6.7 g/dL (ref 6.0–8.3)

## 2021-02-12 LAB — TSH: TSH: 0.77 u[IU]/mL (ref 0.35–5.50)

## 2021-02-12 LAB — VITAMIN B12: Vitamin B-12: 273 pg/mL (ref 211–911)

## 2021-02-12 LAB — HEMOGLOBIN A1C: Hgb A1c MFr Bld: 5.3 % (ref 4.6–6.5)

## 2021-02-12 LAB — VITAMIN D 25 HYDROXY (VIT D DEFICIENCY, FRACTURES): VITD: 21.86 ng/mL — ABNORMAL LOW (ref 30.00–100.00)

## 2021-02-12 MED ORDER — VITAMIN D (ERGOCALCIFEROL) 1.25 MG (50000 UNIT) PO CAPS: 50000.0000 [IU] | ORAL_CAPSULE | ORAL | 0 refills | Status: AC

## 2021-03-28 ENCOUNTER — Other Ambulatory Visit: Payer: Self-pay | Admitting: Internal Medicine

## 2021-03-28 DIAGNOSIS — R1319 Other dysphagia: Secondary | ICD-10-CM

## 2021-04-23 DIAGNOSIS — Z85828 Personal history of other malignant neoplasm of skin: Secondary | ICD-10-CM | POA: Diagnosis not present

## 2021-04-23 DIAGNOSIS — D225 Melanocytic nevi of trunk: Secondary | ICD-10-CM | POA: Diagnosis not present

## 2021-04-23 DIAGNOSIS — L821 Other seborrheic keratosis: Secondary | ICD-10-CM | POA: Diagnosis not present

## 2021-04-23 DIAGNOSIS — L570 Actinic keratosis: Secondary | ICD-10-CM | POA: Diagnosis not present

## 2021-07-14 DIAGNOSIS — M25512 Pain in left shoulder: Secondary | ICD-10-CM | POA: Diagnosis not present

## 2021-07-14 DIAGNOSIS — M5412 Radiculopathy, cervical region: Secondary | ICD-10-CM | POA: Diagnosis not present

## 2021-09-29 ENCOUNTER — Other Ambulatory Visit: Payer: Self-pay | Admitting: Internal Medicine

## 2021-09-29 DIAGNOSIS — R1319 Other dysphagia: Secondary | ICD-10-CM

## 2021-10-01 ENCOUNTER — Ambulatory Visit (INDEPENDENT_AMBULATORY_CARE_PROVIDER_SITE_OTHER): Payer: BC Managed Care – PPO | Admitting: Internal Medicine

## 2021-10-01 VITALS — BP 110/70 | HR 43 | Temp 97.7°F | Ht 68.0 in | Wt 163.5 lb

## 2021-10-01 DIAGNOSIS — E559 Vitamin D deficiency, unspecified: Secondary | ICD-10-CM

## 2021-10-01 DIAGNOSIS — Z114 Encounter for screening for human immunodeficiency virus [HIV]: Secondary | ICD-10-CM

## 2021-10-01 DIAGNOSIS — Z125 Encounter for screening for malignant neoplasm of prostate: Secondary | ICD-10-CM

## 2021-10-01 DIAGNOSIS — I10 Essential (primary) hypertension: Secondary | ICD-10-CM | POA: Diagnosis not present

## 2021-10-01 DIAGNOSIS — Z1159 Encounter for screening for other viral diseases: Secondary | ICD-10-CM | POA: Diagnosis not present

## 2021-10-01 DIAGNOSIS — Z23 Encounter for immunization: Secondary | ICD-10-CM

## 2021-10-01 DIAGNOSIS — Z0000 Encounter for general adult medical examination without abnormal findings: Secondary | ICD-10-CM

## 2021-10-01 DIAGNOSIS — R001 Bradycardia, unspecified: Secondary | ICD-10-CM

## 2021-10-01 LAB — COMPREHENSIVE METABOLIC PANEL
ALT: 20 U/L (ref 0–53)
AST: 25 U/L (ref 0–37)
Albumin: 4.5 g/dL (ref 3.5–5.2)
Alkaline Phosphatase: 47 U/L (ref 39–117)
BUN: 18 mg/dL (ref 6–23)
CO2: 27 mEq/L (ref 19–32)
Calcium: 9.4 mg/dL (ref 8.4–10.5)
Chloride: 102 mEq/L (ref 96–112)
Creatinine, Ser: 1.17 mg/dL (ref 0.40–1.50)
GFR: 69.55 mL/min (ref >60.00)
Glucose, Bld: 81 mg/dL (ref 70–99)
Potassium: 4.8 mEq/L (ref 3.5–5.1)
Sodium: 136 mEq/L (ref 135–145)
Total Bilirubin: 1.1 mg/dL (ref 0.2–1.2)
Total Protein: 6.6 g/dL (ref 6.0–8.3)

## 2021-10-01 LAB — CBC WITH DIFFERENTIAL/PLATELET
Basophils Absolute: 0.1 10*3/uL (ref 0.0–0.1)
Basophils Relative: 2 % (ref 0.0–3.0)
Eosinophils Absolute: 0.2 10*3/uL (ref 0.0–0.7)
Eosinophils Relative: 3.4 % (ref 0.0–5.0)
HCT: 44.3 % (ref 39.0–52.0)
Hemoglobin: 15.3 g/dL (ref 13.0–17.0)
Lymphocytes Relative: 23.7 % (ref 12.0–46.0)
Lymphs Abs: 1.2 10*3/uL (ref 0.7–4.0)
MCHC: 34.5 g/dL (ref 30.0–36.0)
MCV: 92.6 fl (ref 78.0–100.0)
Monocytes Absolute: 0.5 10*3/uL (ref 0.1–1.0)
Monocytes Relative: 9.8 % (ref 3.0–12.0)
Neutro Abs: 3.1 10*3/uL (ref 1.4–7.7)
Neutrophils Relative %: 61.1 % (ref 43.0–77.0)
Platelets: 214 10*3/uL (ref 150.0–400.0)
RBC: 4.78 Mil/uL (ref 4.22–5.81)
RDW: 12.7 % (ref 11.5–15.5)
WBC: 5 10*3/uL (ref 4.0–10.5)

## 2021-10-01 LAB — PSA: PSA: 0.3 ng/mL (ref 0.10–4.00)

## 2021-10-01 LAB — TSH: TSH: 0.87 u[IU]/mL (ref 0.35–5.50)

## 2021-10-01 LAB — HEMOGLOBIN A1C: Hgb A1c MFr Bld: 5.4 % (ref 4.6–6.5)

## 2021-10-01 LAB — LIPID PANEL
Cholesterol: 199 mg/dL (ref 0–200)
HDL: 56.5 mg/dL (ref >39.00)
LDL Cholesterol: 127 mg/dL — ABNORMAL HIGH (ref 0–99)
NonHDL: 142.16
Total CHOL/HDL Ratio: 4
Triglycerides: 76 mg/dL (ref 0.0–149.0)
VLDL: 15.2 mg/dL (ref 0.0–40.0)

## 2021-10-01 LAB — VITAMIN D 25 HYDROXY (VIT D DEFICIENCY, FRACTURES): VITD: 61.54 ng/mL (ref 30.00–100.00)

## 2021-10-01 LAB — VITAMIN B12: Vitamin B-12: 364 pg/mL (ref 211–911)

## 2021-10-01 NOTE — Progress Notes (Signed)
Established Patient Office Visit     CC/Reason for Visit: Annual preventive exam  HPI: John Carey is a 56 y.o. male who is coming in today for the above mentioned reasons. Past Medical History is significant for: Hypertension not on medication, vitamin D deficiency.  He is doing well and has no acute concerns or complaints.  He has routine dental care and is overdue for an eye exam.  He is due for flu and bivalent COVID vaccines.  He had a colonoscopy in 2020.  He has a nurse friend who has recommended that he get a coronary CT scan due to his strong family history of coronary artery disease, both mother and maternal uncle and maternal grandfather had coronary artery disease at a young age.  Last LDL on file was 108.  He is not on a statin.   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 He has never used smokeless tobacco. He reports current alcohol use of about 3.0 standard drinks of alcohol per week. He reports that he does not use drugs.  Allergies: No Known Allergies  Family History:  Family History  Problem Relation Age of Onset   Parkinsonism Mother    CAD Mother        20's   Hypertension Father    Prostate cancer Father 21   CAD Paternal Grandfather        Massive MI at age 35 - heavy alcohol and tobacco use   Diabetes Neg Hx    Colon cancer Neg Hx    Colon polyps Neg Hx    Esophageal cancer Neg Hx    Rectal cancer Neg Hx    Stomach cancer Neg Hx      Current Outpatient Medications:     pantoprazole (PROTONIX) 40 MG tablet, TAKE 1 TABLET BY MOUTH EVERY DAY, Disp: 30 tablet, Rfl: 0  Review of Systems:  Constitutional: Denies fever, chills, diaphoresis, appetite change and fatigue.  HEENT: Denies photophobia, eye pain, redness, hearing loss, ear pain, congestion, sore throat, rhinorrhea, sneezing, mouth sores, trouble swallowing, neck pain, neck stiffness and tinnitus.   Respiratory: Denies SOB, DOE, cough, chest tightness,  and wheezing.   Cardiovascular: Denies chest pain, palpitations and leg swelling.  Gastrointestinal: Denies nausea, vomiting, abdominal pain, diarrhea, constipation, blood in stool and abdominal distention.  Genitourinary: Denies dysuria, urgency, frequency, hematuria, flank pain and difficulty urinating.  Endocrine: Denies: hot or cold intolerance, sweats, changes in hair or nails, polyuria, polydipsia. Musculoskeletal: Denies myalgias, back pain, joint swelling, arthralgias and gait problem.  Skin: Denies pallor, rash and wound.  Neurological: Denies dizziness, seizures, syncope, weakness, light-headedness, numbness and headaches.  Hematological: Denies adenopathy. Easy bruising, personal or family bleeding history  Psychiatric/Behavioral: Denies suicidal ideation, mood changes, confusion, nervousness, sleep disturbance and agitation    Physical Exam: Vitals:   10/01/21 0704  BP: 110/70  Pulse: (!) 43  Temp: 97.7 F (36.5 C)  TempSrc: Oral  SpO2: 98%  Weight: 163 lb 8 oz (74.2 kg)  Height: '5\' 8"'$  (1.727 m)    Body mass index is 24.86 kg/m.   Constitutional: NAD, calm, comfortable Eyes: PERRL, lids and conjunctivae normal ENMT: Mucous membranes are moist. Posterior pharynx clear of any exudate or lesions. Normal dentition. Tympanic membrane is pearly white, no erythema or bulging. Neck: normal, supple, no masses, no thyromegaly Respiratory: clear to auscultation bilaterally, no wheezing, no crackles. Normal respiratory effort. No  accessory muscle use.  Cardiovascular: Regular rate and rhythm, no murmurs / rubs / gallops. No extremity edema. 2+ pedal pulses. No carotid bruits.  Abdomen: no tenderness, no masses palpated. No hepatosplenomegaly. Bowel sounds positive.  Musculoskeletal: no clubbing / cyanosis. No joint deformity upper and lower extremities. Good ROM, no contractures. Normal muscle tone.  Skin: no rashes, lesions, ulcers. No induration Neurologic: CN 2-12 grossly intact. Sensation intact, DTR normal. Strength 5/5 in all 4.  Psychiatric: Normal judgment and insight. Alert and oriented x 3. Normal mood.    Impression and Plan:  Encounter for preventive health examination  Needs flu shot - Plan: Flu Vaccine QUAD 6+ mos PF IM (Fluarix Quad PF)  Primary hypertension - Plan: CBC with Differential/Platelet, Comprehensive metabolic panel, Hemoglobin A1c, Lipid panel, PSA, TSH, Vitamin B12, CT CARDIAC SCORING (SELF PAY ONLY)  Vitamin D deficiency - Plan: VITAMIN D 25 Hydroxy (Vit-D Deficiency, Fractures)  Sinus bradycardia  Encounter for screening for HIV - Plan: HIV antibody (with reflex)  Encounter for hepatitis C screening test for low risk patient - Plan: Hep C Antibody   -Recommend routine eye and dental care. -Immunizations: Flu vaccine in office today, advised bivalent COVID-vaccine at pharmacy, other immunizations are up-to-date -Healthy lifestyle discussed in detail. -Labs to be updated today. -Colon cancer screening: 02/2018, 5-year callback -Breast cancer screening: Not applicable -Cervical cancer screening: Not applicable -Lung cancer screening: Not applicable, never smoker -Prostate cancer screening: PSA today -DEXA: Not applicable -Screen for HIV and hepatitis C. -Per patient request due to strong family history coronary CT has been ordered.    Lelon Frohlich, MD [facility] at [facility]

## 2021-10-02 ENCOUNTER — Other Ambulatory Visit: Payer: Self-pay | Admitting: *Deleted

## 2021-10-02 ENCOUNTER — Encounter: Payer: Self-pay | Admitting: Internal Medicine

## 2021-10-02 DIAGNOSIS — E785 Hyperlipidemia, unspecified: Secondary | ICD-10-CM | POA: Insufficient documentation

## 2021-10-02 LAB — HEPATITIS C ANTIBODY: Hepatitis C Ab: NONREACTIVE

## 2021-10-02 LAB — HIV ANTIBODY (ROUTINE TESTING W REFLEX): HIV 1&2 Ab, 4th Generation: NONREACTIVE

## 2021-10-05 ENCOUNTER — Telehealth: Payer: BC Managed Care – PPO | Admitting: Physician Assistant

## 2021-10-05 DIAGNOSIS — U071 COVID-19: Secondary | ICD-10-CM

## 2021-10-06 MED ORDER — BENZONATATE 100 MG PO CAPS: 100.0000 mg | ORAL_CAPSULE | Freq: Three times a day (TID) | ORAL | 0 refills | Status: DC | PRN

## 2021-10-06 NOTE — Progress Notes (Signed)
E-Visit  for Positive Covid Test Result  We are sorry you are not feeling well. We are here to help!  You have tested positive for COVID-19, meaning that you were infected with the novel coronavirus and could give the virus to others.  It is vitally important that you stay home so you do not spread it to others.      Please continue isolation at home, for at least 10 days since the start of your symptoms and until you have had 24 hours with no fever (without taking a fever reducer) and with improving of symptoms.  If you have no symptoms but tested positive (or all symptoms resolve after 5 days and you have no fever) you can leave your house but continue to wear a mask around others for an additional 5 days. If you have a fever,continue to stay home until you have had 24 hours of no fever. Most cases improve 5-10 days from onset but we have seen a small number of patients who have gotten worse after the 10 days.  Please be sure to watch for worsening symptoms and remain taking the proper precautions.   Go to the nearest hospital ED for assessment if fever/cough/breathlessness are severe or illness seems like a threat to life.    The following symptoms may appear 2-14 days after exposure: Fever Cough Shortness of breath or difficulty breathing Chills Repeated shaking with chills Muscle pain Headache Sore throat New loss of taste or smell Fatigue Congestion or runny nose Nausea or vomiting Diarrhea  You have been enrolled in [place] for COVID-19. Daily you will receive a questionnaire within the [facility] website. Our COVID-19 response team will be monitoring your responses daily.  You can use medication such as prescription cough medication called Tessalon Perles 100 mg. You may take 1-2 capsules every 8 hours as needed for cough  You may also take acetaminophen (Tylenol) as needed for fever.  I see a history of hypertension even though you are not currently on any  medications for this per your chart. As such, antivirals can be considered but are not a necessity, especially if you are having milder symptoms, especially taking into consideration the potential for side effects. If you would like to discuss antivirals further, we recommend submitting a virtual urgent care video visit through your MyChart.   HOME CARE: Only take medications as instructed by your medical team. Drink plenty of fluids and get plenty of rest. A steam or ultrasonic humidifier can help if you have congestion.   GET HELP RIGHT AWAY IF YOU HAVE EMERGENCY WARNING SIGNS.  Call 911 or proceed to your closest emergency facility if: You develop worsening high fever. Trouble breathing Bluish lips or face Persistent pain or pressure in the chest New confusion Inability to wake or stay awake You cough up blood. Your symptoms become more severe Inability to hold down food or fluids  This list is not all possible symptoms. Contact your medical provider for any symptoms that are severe or concerning to you.    Your e-visit answers were reviewed by a board certified advanced clinical practitioner to complete your personal care plan.  Depending on the condition, your plan could have included both over the counter or prescription medications.  If there is a problem please reply once you have received a response from your provider.  Your safety is important to [place].  If you have drug allergies check your prescription carefully.    You  can use MyChart to ask questions about today's visit, request a non-urgent call back, or ask for a work or school excuse for 24 hours related to this e-Visit. If it has been greater than 24 hours you will need to follow up with your provider, or enter a new e-Visit to address those concerns. You will get an e-mail in the next two days asking about your experience.  I hope that your e-visit has been valuable and will speed your recovery. Thank you for using  e-visits.

## 2021-10-06 NOTE — Progress Notes (Signed)
I have spent 5 minutes in review of e-visit questionnaire, review and updating patient chart, medical decision making and response to patient.   Zarek Relph Cody Brylie Sneath, PA-C    

## 2021-11-01 ENCOUNTER — Other Ambulatory Visit: Payer: Self-pay | Admitting: Internal Medicine

## 2021-11-01 DIAGNOSIS — R1319 Other dysphagia: Secondary | ICD-10-CM

## 2021-11-05 ENCOUNTER — Ambulatory Visit (HOSPITAL_BASED_OUTPATIENT_CLINIC_OR_DEPARTMENT_OTHER): Payer: BC Managed Care – PPO

## 2021-11-24 ENCOUNTER — Ambulatory Visit (HOSPITAL_BASED_OUTPATIENT_CLINIC_OR_DEPARTMENT_OTHER)
Admission: RE | Admit: 2021-11-24 | Discharge: 2021-11-24 | Disposition: A | Discharge status: Home / Self Care | Payer: BC Managed Care – PPO | Source: Ambulatory Visit | Attending: Internal Medicine | Admitting: Internal Medicine

## 2021-11-24 DIAGNOSIS — I10 Essential (primary) hypertension: Secondary | ICD-10-CM | POA: Insufficient documentation

## 2021-11-30 ENCOUNTER — Telehealth: Payer: Self-pay | Admitting: Internal Medicine

## 2021-11-30 NOTE — Telephone Encounter (Signed)
Patient says he is returning a call from [place]

## 2021-12-01 ENCOUNTER — Other Ambulatory Visit: Payer: Self-pay | Admitting: *Deleted

## 2021-12-01 DIAGNOSIS — E785 Hyperlipidemia, unspecified: Secondary | ICD-10-CM

## 2021-12-01 MED ORDER — ATORVASTATIN CALCIUM 20 MG PO TABS: 20.0000 mg | ORAL_TABLET | Freq: Every day | ORAL | 3 refills | Status: DC

## 2021-12-01 NOTE — Telephone Encounter (Signed)
Patient is aware of lab results.  See lab result note.

## 2021-12-21 DIAGNOSIS — L570 Actinic keratosis: Secondary | ICD-10-CM | POA: Diagnosis not present

## 2021-12-21 DIAGNOSIS — L810 Postinflammatory hyperpigmentation: Secondary | ICD-10-CM | POA: Diagnosis not present

## 2022-02-24 ENCOUNTER — Ambulatory Visit (INDEPENDENT_AMBULATORY_CARE_PROVIDER_SITE_OTHER): Payer: BC Managed Care – PPO | Admitting: Internal Medicine

## 2022-02-24 VITALS — BP 120/70 | HR 50 | Temp 97.8°F | Wt 159.1 lb

## 2022-02-24 DIAGNOSIS — J069 Acute upper respiratory infection, unspecified: Secondary | ICD-10-CM | POA: Diagnosis not present

## 2022-02-24 NOTE — Progress Notes (Signed)
     Established Patient Office Visit     CC/Reason for Visit: URI symptoms, right ear pressure  HPI: John Carey is a 57 y.o. male who is coming in today for the above mentioned reasons.  Last week he had mild URI symptoms.  He then felt what he thought was fluid in his ears that has improved over the weekend.   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 He has never used smokeless tobacco. He reports current alcohol use of about 3.0 standard drinks of alcohol per week. He reports that he does not use drugs.  Allergies: No Known Allergies  Family History:  Family History  Problem Relation Age of Onset   Parkinsonism Mother    CAD Mother        76's   Hypertension Father    Prostate cancer Father 72   CAD Paternal Grandfather        Massive MI at age 18 - heavy alcohol and tobacco use   Diabetes Neg Hx    Colon cancer Neg Hx    Colon polyps Neg Hx    Esophageal cancer Neg Hx    Rectal cancer Neg Hx    Stomach cancer Neg Hx      Current Outpatient Medications:    pantoprazole (PROTONIX) 40 MG tablet, TAKE 1 TABLET BY MOUTH EVERY DAY, Disp: 90 tablet, Rfl: 1  Review of Systems:  Negative unless indicated in HPI.   Physical Exam: Vitals:   02/24/22 1610  BP: 120/70  Pulse: (!) 50  Temp: 97.8 F (36.6 C)  TempSrc: Oral  SpO2: 100%  Weight: 159 lb 1.6 oz (72.2 kg)    Body mass index is 24.19 kg/m.   Physical Exam Vitals reviewed.  Constitutional:      Appearance: Normal  appearance.  HENT:     Right Ear: A middle ear effusion is present.     Left Ear: Tympanic membrane, ear canal and external ear normal.     Mouth/Throat:     Mouth: Mucous membranes are moist.     Pharynx: Oropharynx is clear.  Eyes:     Conjunctiva/sclera: Conjunctivae normal.     Pupils: Pupils are equal, round, and reactive to light.  Cardiovascular:     Rate and Rhythm: Normal rate and regular rhythm.  Pulmonary:     Effort: Pulmonary effort is normal.     Breath sounds: Normal breath sounds.  Neurological:     Mental Status: He is alert.      Impression and Plan:  Viral upper respiratory tract infection  -Given exam findings, PNA, pharyngitis, ear infection are not likely, hence abx have not been prescribed. -Have advised rest, fluids, OTC antihistamines, cough suppressants and mucinex. -RTC if no improvement in 10-14 days.   Time spent:21 minutes reviewing chart, interviewing and examining patient and formulating plan of care.     Lelon Frohlich, MD [facility] at [facility]

## 2022-03-09 ENCOUNTER — Other Ambulatory Visit: Payer: BC Managed Care – PPO

## 2022-03-16 ENCOUNTER — Other Ambulatory Visit (INDEPENDENT_AMBULATORY_CARE_PROVIDER_SITE_OTHER): Payer: BC Managed Care – PPO

## 2022-03-16 DIAGNOSIS — E785 Hyperlipidemia, unspecified: Secondary | ICD-10-CM

## 2022-03-16 LAB — LIPID PANEL
Cholesterol: 145 mg/dL (ref 0–200)
HDL: 48.6 mg/dL (ref >39.00)
LDL Cholesterol: 74 mg/dL (ref 0–99)
NonHDL: 95.99
Total CHOL/HDL Ratio: 3
Triglycerides: 110 mg/dL (ref 0.0–149.0)
VLDL: 22 mg/dL (ref 0.0–40.0)

## 2022-05-04 DIAGNOSIS — Z85828 Personal history of other malignant neoplasm of skin: Secondary | ICD-10-CM | POA: Diagnosis not present

## 2022-05-04 DIAGNOSIS — D2262 Melanocytic nevi of left upper limb, including shoulder: Secondary | ICD-10-CM | POA: Diagnosis not present

## 2022-05-04 DIAGNOSIS — D3612 Benign neoplasm of peripheral nerves and autonomic nervous system, upper limb, including shoulder: Secondary | ICD-10-CM | POA: Diagnosis not present

## 2022-05-04 DIAGNOSIS — D2261 Melanocytic nevi of right upper limb, including shoulder: Secondary | ICD-10-CM | POA: Diagnosis not present

## 2022-05-10 ENCOUNTER — Encounter: Payer: Self-pay | Admitting: Internal Medicine

## 2022-05-19 ENCOUNTER — Other Ambulatory Visit: Payer: Self-pay | Admitting: Internal Medicine

## 2022-05-19 DIAGNOSIS — R1319 Other dysphagia: Secondary | ICD-10-CM

## 2022-09-16 ENCOUNTER — Encounter (INDEPENDENT_AMBULATORY_CARE_PROVIDER_SITE_OTHER): Payer: Self-pay

## 2022-09-17 ENCOUNTER — Other Ambulatory Visit: Payer: Self-pay | Admitting: Oncology

## 2022-09-17 DIAGNOSIS — Z006 Encounter for examination for normal comparison and control in clinical research program: Secondary | ICD-10-CM

## 2022-09-20 DIAGNOSIS — S61431A Puncture wound without foreign body of right hand, initial encounter: Secondary | ICD-10-CM | POA: Diagnosis not present

## 2022-09-20 DIAGNOSIS — W540XXA Bitten by dog, initial encounter: Secondary | ICD-10-CM | POA: Diagnosis not present

## 2022-09-24 ENCOUNTER — Encounter: Payer: Self-pay | Admitting: Internal Medicine

## 2022-10-20 ENCOUNTER — Encounter: Payer: BC Managed Care – PPO | Admitting: Internal Medicine

## 2022-11-17 ENCOUNTER — Ambulatory Visit: Payer: BC Managed Care – PPO | Admitting: Internal Medicine

## 2022-11-17 VITALS — BP 120/84 | HR 54 | Temp 97.6°F | Ht 68.0 in | Wt 160.4 lb

## 2022-11-17 DIAGNOSIS — Z23 Encounter for immunization: Secondary | ICD-10-CM

## 2022-11-17 DIAGNOSIS — Z0000 Encounter for general adult medical examination without abnormal findings: Secondary | ICD-10-CM

## 2022-11-17 DIAGNOSIS — Z1211 Encounter for screening for malignant neoplasm of colon: Secondary | ICD-10-CM

## 2022-11-17 DIAGNOSIS — E785 Hyperlipidemia, unspecified: Secondary | ICD-10-CM | POA: Diagnosis not present

## 2022-11-17 DIAGNOSIS — E559 Vitamin D deficiency, unspecified: Secondary | ICD-10-CM | POA: Diagnosis not present

## 2022-11-17 DIAGNOSIS — I10 Essential (primary) hypertension: Secondary | ICD-10-CM

## 2022-11-17 LAB — CBC WITH DIFFERENTIAL/PLATELET
Basophils Absolute: 0.1 10*3/uL (ref 0.0–0.1)
Basophils Relative: 1.2 % (ref 0.0–3.0)
Eosinophils Absolute: 0.1 10*3/uL (ref 0.0–0.7)
Eosinophils Relative: 1.6 % (ref 0.0–5.0)
HCT: 42.7 % (ref 39.0–52.0)
Hemoglobin: 14.4 g/dL (ref 13.0–17.0)
Lymphocytes Relative: 23.3 % (ref 12.0–46.0)
Lymphs Abs: 1.8 10*3/uL (ref 0.7–4.0)
MCHC: 33.8 g/dL (ref 30.0–36.0)
MCV: 93 fL (ref 78.0–100.0)
Monocytes Absolute: 0.5 10*3/uL (ref 0.1–1.0)
Monocytes Relative: 7 % (ref 3.0–12.0)
Neutro Abs: 5 10*3/uL (ref 1.4–7.7)
Neutrophils Relative %: 66.9 % (ref 43.0–77.0)
Platelets: 247 10*3/uL (ref 150.0–400.0)
RBC: 4.59 Mil/uL (ref 4.22–5.81)
RDW: 13.1 % (ref 11.5–15.5)
WBC: 7.5 10*3/uL (ref 4.0–10.5)

## 2022-11-17 LAB — COMPREHENSIVE METABOLIC PANEL
ALT: 24 U/L (ref 0–53)
AST: 29 U/L (ref 0–37)
Albumin: 4.4 g/dL (ref 3.5–5.2)
Alkaline Phosphatase: 56 U/L (ref 39–117)
BUN: 16 mg/dL (ref 6–23)
CO2: 28 meq/L (ref 19–32)
Calcium: 9.5 mg/dL (ref 8.4–10.5)
Chloride: 101 meq/L (ref 96–112)
Creatinine, Ser: 0.95 mg/dL (ref 0.40–1.50)
GFR: 88.6 mL/min (ref >60.00)
Glucose, Bld: 76 mg/dL (ref 70–99)
Potassium: 3.9 meq/L (ref 3.5–5.1)
Sodium: 138 meq/L (ref 135–145)
Total Bilirubin: 1.2 mg/dL (ref 0.2–1.2)
Total Protein: 6.6 g/dL (ref 6.0–8.3)

## 2022-11-17 LAB — LIPID PANEL
Cholesterol: 185 mg/dL (ref 0–200)
HDL: 59.8 mg/dL (ref >39.00)
LDL Cholesterol: 113 mg/dL — ABNORMAL HIGH (ref 0–99)
NonHDL: 124.96
Total CHOL/HDL Ratio: 3
Triglycerides: 60 mg/dL (ref 0.0–149.0)
VLDL: 12 mg/dL (ref 0.0–40.0)

## 2022-11-17 LAB — PSA: PSA: 0.36 ng/mL (ref 0.10–4.00)

## 2022-11-17 LAB — VITAMIN D 25 HYDROXY (VIT D DEFICIENCY, FRACTURES): VITD: 40.33 ng/mL (ref 30.00–100.00)

## 2022-11-17 NOTE — Progress Notes (Signed)
Established Patient Office Visit     CC/Reason for Visit: Annual preventive exam  HPI: John Carey is a 57 y.o. male who is coming in today for the above mentioned reasons. Past Medical History is significant for: Hypertension, hyperlipidemia and vitamin D deficiency.  He is feeling well and has no acute concerns or complaints.  Is due for flu and COVID vaccines.  Will be due for 5-year colonoscopy in January 2025.   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 He has never used smokeless tobacco. He reports current alcohol use of about 3.0 standard drinks of alcohol per week. He reports that he does not use drugs.  Allergies: No Known Allergies  Family History:  Family History  Problem Relation Age of Onset   Parkinsonism Mother    CAD Mother        75's   Hypertension Father    Prostate cancer Father 42   CAD Paternal Grandfather        Massive MI at age 62 - heavy alcohol and tobacco use   Diabetes Neg Hx    Colon cancer Neg Hx    Colon polyps Neg Hx    Esophageal cancer Neg Hx    Rectal cancer Neg Hx    Stomach cancer Neg Hx      Current Outpatient Medications:    pantoprazole (PROTONIX) 40 MG tablet, TAKE 1 TABLET BY MOUTH EVERY DAY, Disp: 90 tablet, Rfl: 1  Review of Systems:  Negative unless indicated in HPI.   Physical Exam: Vitals:   11/17/22 1254  BP: 120/84  Pulse: (!) 54  Temp: 97.6 F (36.4 C)  TempSrc: Oral  SpO2: 100%  Weight: 160 lb 6.4 oz (72.8 kg)  Height:  5\' 8"  (1.727 m)    Body mass index is 24.39 kg/m.   Physical Exam Vitals reviewed.  Constitutional:      General: He is not in acute distress.    Appearance: Normal appearance. He is not ill-appearing, toxic-appearing or diaphoretic.  HENT:     Head: Normocephalic.     Right Ear: Tympanic membrane, ear canal and external ear normal. There is no impacted cerumen.     Left Ear: Tympanic membrane, ear canal and external ear normal. There is no impacted cerumen.     Nose: Nose normal.     Mouth/Throat:     Mouth: Mucous membranes are moist.     Pharynx: Oropharynx is clear. No oropharyngeal exudate or posterior oropharyngeal erythema.  Eyes:     General: No scleral icterus.       Right eye: No discharge.        Left eye: No discharge.     Conjunctiva/sclera: Conjunctivae normal.     Pupils: Pupils are equal, round, and reactive to light.  Neck:     Vascular: No carotid bruit.  Cardiovascular:     Rate and Rhythm: Normal rate and regular rhythm.     Pulses:  Normal pulses.     Heart sounds: Normal heart sounds.  Pulmonary:     Effort: Pulmonary effort is normal. No respiratory distress.     Breath sounds: Normal breath sounds.  Abdominal:     General: Abdomen is flat. Bowel sounds are normal.     Palpations: Abdomen is soft.  Musculoskeletal:        General: Normal range of motion.     Cervical back: Normal range of motion.  Skin:    General: Skin is warm and dry.  Neurological:     General: No focal deficit present.     Mental Status: He is alert and oriented to person, place, and time. Mental status is at baseline.  Psychiatric:        Mood and Affect: Mood normal.        Behavior: Behavior normal.        Thought Content: Thought content normal.        Judgment: Judgment normal.     Impression and Plan:  Encounter for preventive health examination -     PSA; Future  Primary hypertension -     CBC with Differential/Platelet; Future -     Comprehensive metabolic  panel; Future  Vitamin D deficiency -     VITAMIN D 25 Hydroxy (Vit-D Deficiency, Fractures); Future  Hyperlipidemia, unspecified hyperlipidemia type -     Lipid panel; Future  Screening for colon cancer -     Ambulatory referral to Gastroenterology  Immunization due   -Recommend routine eye and dental care. -Healthy lifestyle discussed in detail. -Labs to be updated today. -Prostate cancer screening: PSA today Health Maintenance  Topic Date Due   COVID-19 Vaccine (1) Never done   Flu Shot  09/02/2022   Colon Cancer Screening  02/18/2023   DTaP/Tdap/Td vaccine (3 - Td or Tdap) 04/23/2027   Hepatitis C Screening  Completed   HIV Screening  Completed   Zoster (Shingles) Vaccine  Completed   HPV Vaccine  Aged Out     -Flu vaccine in office today.  Will obtain COVID-vaccine at a later date. -Send referral to GI as he will be due for 5-year screening colonoscopy in January 2025.     Chaya Jan, MD [facility] at [facility]

## 2022-11-17 NOTE — Addendum Note (Signed)
Addended by: Kern Reap B on: 11/17/2022 01:43 PM   Modules accepted: Orders

## 2022-11-18 ENCOUNTER — Encounter: Payer: Self-pay | Admitting: Internal Medicine

## 2022-11-18 MED ORDER — ATORVASTATIN CALCIUM 20 MG PO TABS: 20.0000 mg | ORAL_TABLET | Freq: Every day | ORAL | 1 refills | Status: DC

## 2022-11-23 ENCOUNTER — Other Ambulatory Visit: Payer: Self-pay | Admitting: Internal Medicine

## 2022-11-23 ENCOUNTER — Other Ambulatory Visit (HOSPITAL_COMMUNITY)
Admission: RE | Admit: 2022-11-23 | Discharge: 2022-11-23 | Disposition: A | Discharge status: Home / Self Care | Payer: BC Managed Care – PPO | Source: Ambulatory Visit | Attending: Oncology | Admitting: Oncology

## 2022-11-23 DIAGNOSIS — Z006 Encounter for examination for normal comparison and control in clinical research program: Secondary | ICD-10-CM | POA: Insufficient documentation

## 2022-11-23 DIAGNOSIS — R1319 Other dysphagia: Secondary | ICD-10-CM

## 2022-11-30 LAB — HELIX MOLECULAR SCREEN: Genetic Analysis Overall Interpretation: NEGATIVE

## 2022-11-30 LAB — GENECONNECT MOLECULAR SCREEN

## 2022-12-01 ENCOUNTER — Encounter: Payer: Self-pay | Admitting: Gastroenterology

## 2022-12-14 DIAGNOSIS — Z96641 Presence of right artificial hip joint: Secondary | ICD-10-CM | POA: Diagnosis not present

## 2022-12-14 DIAGNOSIS — S76011A Strain of muscle, fascia and tendon of right hip, initial encounter: Secondary | ICD-10-CM | POA: Diagnosis not present

## 2023-01-28 ENCOUNTER — Ambulatory Visit (AMBULATORY_SURGERY_CENTER): Payer: BC Managed Care – PPO | Admitting: *Deleted

## 2023-01-28 ENCOUNTER — Telehealth: Payer: Self-pay | Admitting: *Deleted

## 2023-01-28 VITALS — Ht 68.0 in | Wt 155.0 lb

## 2023-01-28 DIAGNOSIS — Z860100 Personal history of colon polyps, unspecified: Secondary | ICD-10-CM

## 2023-01-28 MED ORDER — NA SULFATE-K SULFATE-MG SULF 17.5-3.13-1.6 GM/177ML PO SOLN: 1.0000 | Freq: Once | ORAL | 0 refills | Status: AC

## 2023-01-28 NOTE — Telephone Encounter (Signed)
 Attempt to reach pt for pre-visit. LM with call back #.  Will attempt to reach again in 5 min due to no other # listed in profile  2nd attempt reached pt

## 2023-01-28 NOTE — Progress Notes (Signed)
 Pt's name and DOB verified at the beginning of the pre-visit wit 2 identifiers  Pt denies any difficulty with ambulating,sitting, laying down or rolling side to side  Pt has no issues with ambulation   Pt has no issues moving head neck or swallowing  No egg or soy allergy known to patient   No issues known to pt with past sedation with any surgeries or procedures  Pt denies having issues being intubated  No FH of Malignant Hyperthermia  Pt is not on diet pills or shots  Pt is not on home 02   Pt is not on blood thinners   Pt denies issues with constipation   Pt is not on dialysis  Pt denise any abnormal heart rhythms   Pt denies any upcoming cardiac testing  Pt encouraged to use to use Singlecare or Goodrx to reduce cost   Patient's chart reviewed by Cathlyn Parsons CNRA prior to pre-visit and patient appropriate for the [facility].  Pre-visit completed and red dot placed by patient's name on their procedure day (on provider's schedule).  .  Visit by phone  Pt states weight is 155 lb  Instructed pt why it is important to and  to call if they have any changes in health or new medications. Directed them to the # given and on instructions.     Instructions reviewed. Pt given both [facility] main # and MD on call # prior to instructions.  Pt states understanding. Instructed to review again prior to procedure. Pt states they will.   Instructions sent by mail with coupon and by My Chart   Coupon sent via text to mobile phone and pt verified they received it

## 2023-02-22 ENCOUNTER — Encounter: Payer: Self-pay | Admitting: Certified Registered Nurse Anesthetist

## 2023-02-22 ENCOUNTER — Encounter: Payer: Self-pay | Admitting: Gastroenterology

## 2023-02-24 ENCOUNTER — Ambulatory Visit: Payer: BC Managed Care – PPO | Admitting: Gastroenterology

## 2023-02-24 ENCOUNTER — Encounter: Payer: Self-pay | Admitting: Gastroenterology

## 2023-02-24 VITALS — BP 109/61 | HR 43 | Temp 98.0°F | Resp 13 | Ht 68.0 in | Wt 155.0 lb

## 2023-02-24 DIAGNOSIS — K5730 Diverticulosis of large intestine without perforation or abscess without bleeding: Secondary | ICD-10-CM | POA: Diagnosis not present

## 2023-02-24 DIAGNOSIS — K648 Other hemorrhoids: Secondary | ICD-10-CM

## 2023-02-24 DIAGNOSIS — Z860101 Personal history of adenomatous and serrated colon polyps: Secondary | ICD-10-CM

## 2023-02-24 DIAGNOSIS — Z1211 Encounter for screening for malignant neoplasm of colon: Secondary | ICD-10-CM

## 2023-02-24 DIAGNOSIS — Z860100 Personal history of colon polyps, unspecified: Secondary | ICD-10-CM

## 2023-02-24 DIAGNOSIS — D124 Benign neoplasm of descending colon: Secondary | ICD-10-CM

## 2023-02-24 DIAGNOSIS — D127 Benign neoplasm of rectosigmoid junction: Secondary | ICD-10-CM

## 2023-02-24 MED ORDER — SODIUM CHLORIDE 0.9 % IV SOLN: 500.0000 mL | Freq: Once | INTRAVENOUS | Status: DC

## 2023-02-24 NOTE — Progress Notes (Signed)
Report given to PACU, vss 

## 2023-02-24 NOTE — Patient Instructions (Signed)

## 2023-02-24 NOTE — Progress Notes (Signed)
[facility] History and Physical   Primary Care Physician:  Philip Aspen, Limmie Patricia, MD   Reason for Procedure:   History of colon polyps  Plan:    colonoscopy     HPI: John Carey is a 58 y.o. male  here for colonoscopy surveillance - adenomas removed 02/2018.  Patient denies any bowel symptoms at this time. No family history of colon cancer known. Otherwise feels well without any cardiopulmonary symptoms.   I have discussed risks / benefits of anesthesia and endoscopic procedure with Teodoro Kil and they wish to proceed with the exams as outlined today.    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 Authorizing Provider  atorvastatin (LIPITOR) 20 MG tablet Take 1 tablet (20 mg total) by mouth daily. 11/18/22  Yes Philip Aspen, Limmie Patricia, MD  Multiple Vitamin (MULTIVITAMIN) capsule Take 1 capsule by mouth daily. Mens   Yes [provider]  pantoprazole (PROTONIX) 40 MG tablet TAKE 1 TABLET BY MOUTH EVERY DAY 11/23/22  Yes Philip Aspen, Limmie Patricia, MD    Current Outpatient Medications  Medication Sig Dispense Refill   atorvastatin (LIPITOR) 20 MG tablet Take 1 tablet (20 mg total) by mouth daily. 90 tablet 1   Multiple Vitamin (MULTIVITAMIN) capsule Take 1 capsule by mouth daily. Mens     pantoprazole (PROTONIX) 40 MG tablet TAKE 1 TABLET BY MOUTH EVERY DAY 90 tablet 1   Current Facility-Administered Medications  Medication Dose Route Frequency Provider Last Rate Last Admin    0.9 %  sodium chloride infusion  500 mL Intravenous Once Jadence Kinlaw, Willaim Rayas, MD        Allergies as of 02/24/2023   (No Known Allergies)    Family History  Problem Relation Age of Onset   Parkinsonism Mother    CAD Mother        85's   Hypertension Father    Prostate cancer Father 27   CAD Paternal Grandfather        Massive MI at age 61 - heavy alcohol and tobacco use   Diabetes Neg Hx    Colon cancer Neg Hx    Colon polyps Neg Hx    Esophageal cancer Neg Hx    Rectal cancer Neg Hx    Stomach cancer Neg Hx     Social History   Socioeconomic History   Marital status: Married    Spouse name: Not on file   Number of children: Not on file   Years of education: Not on file   Highest education level: Master's degree (e.g., MA, MS, MEng, MEd, MSW, MBA)  Occupational History   Not on file  Tobacco Use   Smoking status: Never   Smokeless tobacco: Never  Vaping Use   Vaping status: Never Used  Substance and Sexual Activity   Alcohol use: Yes    Alcohol/week: 3.0 standard drinks of alcohol    Types: 3 Cans of beer per week  Comment: social   Drug use: No   Sexual activity: Not on file  Other Topics Concern   Not on file  Social History Narrative   Not on file   Social Drivers of Health   Financial Resource Strain: Low Risk  (11/17/2022)   Overall Financial Resource Strain (CARDIA)    Difficulty of Paying Living Expenses: Not hard at all  Food Insecurity: No Food Insecurity (11/17/2022)   Hunger Vital Sign    Worried About Running Out of Food in the Last Year: Never true    Ran Out of Food in the Last Year: Never true  Transportation Needs: No Transportation Needs (11/17/2022)   PRAPARE - Administrator, Civil Service (Medical): No    Lack of Transportation (Non-Medical): No  Physical Activity: Insufficiently Active (11/17/2022)   Exercise Vital Sign    Days of Exercise per Week: 4 days    Minutes of Exercise per Session: 30 min  Stress: Stress  Concern Present (11/17/2022)   [employer] of Occupational Health - Occupational Stress Questionnaire    Feeling of Stress : To some extent  Social Connections: Socially Integrated (11/17/2022)   Social Connection and Isolation Panel [NHANES]    Frequency of Communication with Friends and Family: More than three times a week    Frequency of Social Gatherings with Friends and Family: More than three times a week    Attends Religious Services: 1 to 4 times per year    Active Member of [employer] or Organizations: Yes    Attends Banker Meetings: 1 to 4 times per year    Marital Status: Married  Catering manager Violence: Unknown (09/20/2022)   Received from [facility]   HITS    Physically Hurt: Not on file    Insult or Talk Down To: Not on file    Threaten Physical Harm: Not on file    Scream or Curse: Not on file    Review of Systems: All other review of systems negative except as mentioned in the HPI.  Physical Exam: Vital signs BP (!) 151/87   Pulse (!) 44   Temp 98 F (36.7 C) (Temporal)   Resp 10   Ht 5\' 8"  (1.727 m)   Wt 155 lb (70.3 kg)   SpO2 100%   BMI 23.57 kg/m   General:   Alert,  Well-developed, pleasant and cooperative in NAD Lungs:  Clear throughout to auscultation.   Heart:  Regular rate and rhythm Abdomen:  Soft, nontender and nondistended.   Neuro/Psych:  Alert and cooperative. Normal mood and affect. A and O x 3  Harlin Rain, MD [facility]

## 2023-02-24 NOTE — Op Note (Signed)
[facility] Patient Name: John Carey Procedure Date: 02/24/2023 1:21 PM MRN: [id number] Endoscopist: Viviann Spare P. Adela Lank , MD, [id number] Age: 58 Referring MD:  Date of Birth: 01-09-1966 Gender: Male Account #: [id number] Procedure:                Colonoscopy Indications:              High risk colon cancer surveillance: Personal                            history of colonic polyps - adenomas removed 2020 Medicines:                Monitored Anesthesia Care Procedure:                Pre-Anesthesia Assessment:                           - Prior to the procedure, a History and Physical                            was performed, and patient medications and                            allergies were reviewed. The patient's tolerance of                            previous anesthesia was also reviewed. The risks                            and benefits of the procedure and the sedation                            options and risks were discussed with the patient.                            All questions were answered, and informed consent                            was obtained. Prior Anticoagulants: The patient has                            taken no anticoagulant or antiplatelet agents. ASA                            Grade Assessment: II - A patient with mild systemic                            disease. After reviewing the risks and benefits,                            the patient was deemed in satisfactory condition to                            undergo the procedure.  After obtaining informed consent, the colonoscope                            was passed under direct vision. Throughout the                            procedure, the patient's blood pressure, pulse, and                            oxygen saturations were monitored continuously. The                            Olympus Scope SN [id number] was introduced through the                            anus  and advanced to the the cecum, identified by                            appendiceal orifice and ileocecal valve. The                            colonoscopy was performed without difficulty. The                            patient tolerated the procedure well. The quality                            of the bowel preparation was good. The ileocecal                            valve, appendiceal orifice, and rectum were                            photographed. Scope In: 1:28:36 PM Scope Out: 1:44:13 PM Scope Withdrawal Time: 0 hours 11 minutes 4 seconds  Total Procedure Duration: 0 hours 15 minutes 37 seconds  Findings:                 The perianal and digital rectal examinations were                            normal.                           A diminutive polyp was found in the descending                            colon. The polyp was sessile. The polyp was removed                            with a cold snare. Resection and retrieval were                            complete.  A 4 mm polyp was found in the recto-sigmoid colon.                            The polyp was sessile. The polyp was removed with a                            cold snare. Resection and retrieval were complete.                           A few small-mouthed diverticula were found in the                            sigmoid colon.                           Internal hemorrhoids were found during                            retroflexion. The hemorrhoids were small.                           The exam was otherwise without abnormality. Complications:            No immediate complications. Estimated blood loss:                            Minimal. Estimated Blood Loss:     Estimated blood loss was minimal. Impression:               - One diminutive polyp in the descending colon,                            removed with a cold snare. Resected and retrieved.                           - One 4 mm polyp at the  recto-sigmoid colon,                            removed with a cold snare. Resected and retrieved.                           - Diverticulosis in the sigmoid colon.                           - Internal hemorrhoids.                           - The examination was otherwise normal. Recommendation:           - Patient has a contact number available for                            emergencies. The signs and symptoms of potential                            delayed  complications were discussed with the                            patient. Return to normal activities tomorrow.                            Written discharge instructions were provided to the                            patient.                           - Resume previous diet.                           - Continue present medications.                           - Await pathology results. Viviann Spare P. Kennidi Yoshida, MD 02/24/2023 1:47:54 PM This report has been signed electronically.

## 2023-02-24 NOTE — Progress Notes (Signed)
Called to room to assist during endoscopic procedure.  Patient ID and intended procedure confirmed with present staff. Received instructions for my participation in the procedure from the performing physician.  

## 2023-02-24 NOTE — Progress Notes (Signed)
Pt's states no medical or surgical changes since previsit or office visit. 

## 2023-02-25 ENCOUNTER — Telehealth: Payer: Self-pay

## 2023-02-25 NOTE — Telephone Encounter (Signed)
  Follow up Call-     02/24/2023   12:43 PM  Call back number  Post procedure Call Back phone  # [phone]  Permission to leave phone message Yes     Patient questions:  Do you have a fever, pain , or abdominal swelling? No. Pain Score  0 *  Have you tolerated food without any problems? Yes.    Have you been able to return to your normal activities? Yes.    Do you have any questions about your discharge instructions: Diet   No. Medications  No. Follow up visit  No.  Do you have questions or concerns about your Care? No.  Actions: * If pain score is 4 or above: No action needed, pain <4.

## 2023-03-01 LAB — SURGICAL PATHOLOGY

## 2023-03-02 ENCOUNTER — Encounter: Payer: Self-pay | Admitting: Gastroenterology

## 2023-05-16 DIAGNOSIS — B078 Other viral warts: Secondary | ICD-10-CM | POA: Diagnosis not present

## 2023-05-16 DIAGNOSIS — L821 Other seborrheic keratosis: Secondary | ICD-10-CM | POA: Diagnosis not present

## 2023-05-16 DIAGNOSIS — Z85828 Personal history of other malignant neoplasm of skin: Secondary | ICD-10-CM | POA: Diagnosis not present

## 2023-05-16 DIAGNOSIS — L738 Other specified follicular disorders: Secondary | ICD-10-CM | POA: Diagnosis not present

## 2023-05-16 DIAGNOSIS — D225 Melanocytic nevi of trunk: Secondary | ICD-10-CM | POA: Diagnosis not present

## 2023-05-21 ENCOUNTER — Other Ambulatory Visit: Payer: Self-pay | Admitting: Internal Medicine

## 2023-05-21 DIAGNOSIS — R1319 Other dysphagia: Secondary | ICD-10-CM

## 2023-06-16 DIAGNOSIS — Z1231 Encounter for screening mammogram for malignant neoplasm of breast: Secondary | ICD-10-CM | POA: Diagnosis not present

## 2023-07-07 DIAGNOSIS — R928 Other abnormal and inconclusive findings on diagnostic imaging of breast: Secondary | ICD-10-CM | POA: Diagnosis not present

## 2023-07-10 DIAGNOSIS — T7849XA Other allergy, initial encounter: Secondary | ICD-10-CM | POA: Diagnosis not present

## 2023-07-10 DIAGNOSIS — R21 Rash and other nonspecific skin eruption: Secondary | ICD-10-CM | POA: Diagnosis not present

## 2023-11-21 ENCOUNTER — Ambulatory Visit: Admitting: Internal Medicine

## 2023-11-22 ENCOUNTER — Ambulatory Visit (INDEPENDENT_AMBULATORY_CARE_PROVIDER_SITE_OTHER): Admitting: Internal Medicine

## 2023-11-22 ENCOUNTER — Encounter: Payer: Self-pay | Admitting: Internal Medicine

## 2023-11-22 ENCOUNTER — Ambulatory Visit: Payer: Self-pay | Admitting: Internal Medicine

## 2023-11-22 VITALS — BP 110/80 | HR 48 | Temp 98.0°F | Ht 68.0 in | Wt 163.8 lb

## 2023-11-22 DIAGNOSIS — Z23 Encounter for immunization: Secondary | ICD-10-CM | POA: Diagnosis not present

## 2023-11-22 DIAGNOSIS — Z125 Encounter for screening for malignant neoplasm of prostate: Secondary | ICD-10-CM

## 2023-11-22 DIAGNOSIS — E785 Hyperlipidemia, unspecified: Secondary | ICD-10-CM

## 2023-11-22 DIAGNOSIS — E559 Vitamin D deficiency, unspecified: Secondary | ICD-10-CM | POA: Diagnosis not present

## 2023-11-22 DIAGNOSIS — I10 Essential (primary) hypertension: Secondary | ICD-10-CM

## 2023-11-22 DIAGNOSIS — Z0000 Encounter for general adult medical examination without abnormal findings: Secondary | ICD-10-CM

## 2023-11-22 LAB — LIPID PANEL
Cholesterol: 148 mg/dL (ref 0–200)
HDL: 57.1 mg/dL (ref >39.00)
LDL Cholesterol: 77 mg/dL (ref 0–99)
NonHDL: 90.47
Total CHOL/HDL Ratio: 3
Triglycerides: 65 mg/dL (ref 0.0–149.0)
VLDL: 13 mg/dL (ref 0.0–40.0)

## 2023-11-22 LAB — CBC WITH DIFFERENTIAL/PLATELET
Basophils Absolute: 0.1 K/uL (ref 0.0–0.1)
Basophils Relative: 2.1 % (ref 0.0–3.0)
Eosinophils Absolute: 0.2 K/uL (ref 0.0–0.7)
Eosinophils Relative: 2.7 % (ref 0.0–5.0)
HCT: 41.8 % (ref 39.0–52.0)
Hemoglobin: 14.4 g/dL (ref 13.0–17.0)
Lymphocytes Relative: 18.6 % (ref 12.0–46.0)
Lymphs Abs: 1.1 K/uL (ref 0.7–4.0)
MCHC: 34.4 g/dL (ref 30.0–36.0)
MCV: 90.6 fl (ref 78.0–100.0)
Monocytes Absolute: 0.5 K/uL (ref 0.1–1.0)
Monocytes Relative: 8 % (ref 3.0–12.0)
Neutro Abs: 3.9 K/uL (ref 1.4–7.7)
Neutrophils Relative %: 68.6 % (ref 43.0–77.0)
Platelets: 231 K/uL (ref 150.0–400.0)
RBC: 4.61 Mil/uL (ref 4.22–5.81)
RDW: 13.4 % (ref 11.5–15.5)
WBC: 5.7 K/uL (ref 4.0–10.5)

## 2023-11-22 LAB — COMPREHENSIVE METABOLIC PANEL WITH GFR
ALT: 28 U/L (ref 0–53)
AST: 27 U/L (ref 0–37)
Albumin: 4.4 g/dL (ref 3.5–5.2)
Alkaline Phosphatase: 62 U/L (ref 39–117)
BUN: 19 mg/dL (ref 6–23)
CO2: 28 meq/L (ref 19–32)
Calcium: 9.3 mg/dL (ref 8.4–10.5)
Chloride: 103 meq/L (ref 96–112)
Creatinine, Ser: 1.06 mg/dL (ref 0.40–1.50)
GFR: 77.13 mL/min (ref >60.00)
Glucose, Bld: 98 mg/dL (ref 70–99)
Potassium: 4.3 meq/L (ref 3.5–5.1)
Sodium: 138 meq/L (ref 135–145)
Total Bilirubin: 0.7 mg/dL (ref 0.2–1.2)
Total Protein: 6.4 g/dL (ref 6.0–8.3)

## 2023-11-22 LAB — PSA: PSA: 0.95 ng/mL (ref 0.10–4.00)

## 2023-11-22 LAB — VITAMIN D 25 HYDROXY (VIT D DEFICIENCY, FRACTURES): VITD: 51.01 ng/mL (ref 30.00–100.00)

## 2023-11-22 LAB — VITAMIN B12: Vitamin B-12: 259 pg/mL (ref 211–911)

## 2023-11-22 LAB — TSH: TSH: 0.71 u[IU]/mL (ref 0.35–5.50)

## 2023-11-22 NOTE — Addendum Note (Signed)
 Addended by: KATHRYNE MILLMAN B on: 11/22/2023 04:42 PM   Modules accepted: Orders

## 2023-11-22 NOTE — Progress Notes (Signed)
 Established Patient Office Visit     CC/Reason for Visit: Annual preventive exam  HPI: John Carey is a 58 y.o. male who is coming in today for the above mentioned reasons. Past Medical History is significant for: Hypertension, hyperlipidemia, vitamin D  deficiency.  Feeling well, no acute concerns or complaints.  Has routine eye and dental care.  Is due for COVID, flu, pneumonia vaccines.  Had his colonoscopy in January 2025.   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 He has never used smokeless tobacco. He reports current alcohol use of about 3.0 standard drinks of alcohol per week. He reports that he does not use drugs.  Allergies: No Known Allergies  Family History:  Family History  Problem Relation Age of Onset   Parkinsonism Mother    CAD Mother        44's   Hypertension Father    Prostate cancer Father 39   CAD Paternal Grandfather        Massive MI at age 18 - heavy alcohol and tobacco use   Diabetes Neg Hx    Colon cancer Neg Hx    Colon polyps Neg Hx    Esophageal cancer Neg Hx    Rectal cancer Neg Hx    Stomach cancer Neg Hx      Current Outpatient Medications:    atorvastatin  (LIPITOR) 20 MG tablet, TAKE 1 TABLET BY MOUTH EVERY DAY, Disp: 90 tablet, Rfl: 1   Multiple Vitamin (MULTIVITAMIN) capsule, Take 1 capsule by mouth daily. Mens, Disp: , Rfl:    pantoprazole  (PROTONIX ) 40 MG tablet, TAKE 1 TABLET BY MOUTH EVERY DAY, Disp: 90 tablet, Rfl: 1  Review of Systems:   Negative unless indicated in HPI.   Physical Exam: Vitals:   11/22/23 0828  BP: 110/80  Pulse: (!) 48  Temp: 98 F (36.7 C)  TempSrc: Oral  SpO2: 99%  Weight: 163 lb 12.8 oz (74.3 kg)  Height: 5' 8 (1.727 m)    Body mass index is 24.91 kg/m.   Physical Exam Vitals reviewed.  Constitutional:      General: He is not in acute distress.    Appearance: Normal appearance. He is not ill-appearing, toxic-appearing or diaphoretic.  HENT:     Head: Normocephalic.     Right Ear: Tympanic membrane, ear canal and external ear normal. There is no impacted cerumen.     Left Ear: Tympanic membrane, ear canal and external ear normal. There is no impacted cerumen.     Nose: Nose normal.     Mouth/Throat:     Mouth: Mucous membranes are moist.     Pharynx: Oropharynx is clear. No oropharyngeal exudate or posterior oropharyngeal erythema.  Eyes:     General: No scleral icterus.       Right eye: No discharge.        Left eye: No discharge.     Conjunctiva/sclera: Conjunctivae normal.     Pupils: Pupils are  equal, round, and reactive to light.  Neck:     Vascular: No carotid bruit.  Cardiovascular:     Rate and Rhythm: Normal rate and regular rhythm.     Pulses: Normal pulses.     Heart sounds: Normal heart sounds.  Pulmonary:     Effort: Pulmonary effort is normal. No respiratory distress.     Breath sounds: Normal breath sounds.  Abdominal:     General: Abdomen is flat. Bowel sounds are normal.     Palpations: Abdomen is soft.  Musculoskeletal:        General: Normal range of motion.     Cervical back: Normal range of motion.  Skin:    General: Skin is warm and dry.  Neurological:     General: No focal deficit present.     Mental Status: He is alert and oriented to person, place, and time. Mental status is at baseline.  Psychiatric:        Mood and Affect: Mood normal.        Behavior: Behavior normal.        Thought Content: Thought content normal.        Judgment:  Judgment normal.       Impression and Plan:  Encounter for preventive health examination -     PSA; Future  Vitamin D  deficiency -     TSH; Future -     Vitamin B12; Future -     VITAMIN D  25 Hydroxy (Vit-D Deficiency, Fractures); Future  Hyperlipidemia, unspecified hyperlipidemia type -     Lipid panel; Future  Primary hypertension -     CBC with Differential/Platelet; Future -     Comprehensive metabolic panel with GFR; Future  Immunization due   -Recommend routine eye and dental care. -Healthy lifestyle discussed in detail. -Labs to be updated today. -Prostate cancer screening: PSA today Health Maintenance  Topic Date Due   Hepatitis B Vaccine (1 of 3 - 19+ 3-dose series) Never done   Pneumococcal Vaccine for age over 40 (1 of 1 - PCV) Never done   COVID-19 Vaccine (2 - Pfizer risk series) 01/29/2023   Flu Shot  09/02/2023   DTaP/Tdap/Td vaccine (3 - Td or Tdap) 04/23/2027   Colon Cancer Screening  02/23/2030   Hepatitis C Screening  Completed   HIV Screening  Completed   Zoster (Shingles) Vaccine  Completed   HPV Vaccine  Aged Out   Meningitis B Vaccine  Aged Out    - Flu and pneumonia vaccines today.  He will update COVID at a later date.    John Theophilus Andrews, MD [facility]

## 2023-11-30 ENCOUNTER — Other Ambulatory Visit: Payer: Self-pay | Admitting: Internal Medicine

## 2023-11-30 ENCOUNTER — Encounter: Admitting: Internal Medicine

## 2023-11-30 DIAGNOSIS — R1319 Other dysphagia: Secondary | ICD-10-CM

## 2023-12-07 ENCOUNTER — Encounter: Admitting: Internal Medicine

## 2023-12-21 ENCOUNTER — Encounter: Payer: Self-pay | Admitting: Internal Medicine
